# Patient Record
Sex: Female | Born: 1970 | Race: Black or African American | Hispanic: No | Marital: Single | Smoking: Never smoker
Health system: Southern US, Community
[De-identification: ages and names within clinical notes are randomized; demographics above are authoritative.]

## PROBLEM LIST (undated history)

## (undated) DIAGNOSIS — D649 Anemia, unspecified: Secondary | ICD-10-CM

## (undated) DIAGNOSIS — R198 Other specified symptoms and signs involving the digestive system and abdomen: Secondary | ICD-10-CM

## (undated) DIAGNOSIS — N920 Excessive and frequent menstruation with regular cycle: Secondary | ICD-10-CM

## (undated) DIAGNOSIS — Z8619 Personal history of other infectious and parasitic diseases: Secondary | ICD-10-CM

## (undated) DIAGNOSIS — L509 Urticaria, unspecified: Secondary | ICD-10-CM

## (undated) HISTORY — DX: Urticaria, unspecified: L50.9

## (undated) HISTORY — PX: CHOLECYSTECTOMY: SHX55

## (undated) HISTORY — DX: Other specified symptoms and signs involving the digestive system and abdomen: R19.8

## (undated) HISTORY — PX: ABDOMINAL HYSTERECTOMY: SHX81

## (undated) HISTORY — PX: TYMPANOSTOMY TUBE PLACEMENT: SHX32

## (undated) HISTORY — PX: TONSILLECTOMY: SUR1361

## (undated) HISTORY — DX: Personal history of other infectious and parasitic diseases: Z86.19

## (undated) HISTORY — PX: OTHER SURGICAL HISTORY: SHX169

## (undated) HISTORY — DX: Anemia, unspecified: D64.9

## (undated) HISTORY — PX: ADENOIDECTOMY: SUR15

## (undated) HISTORY — PX: TUBAL LIGATION: SHX77

## (undated) HISTORY — DX: Excessive and frequent menstruation with regular cycle: N92.0

---

## 1998-09-17 ENCOUNTER — Emergency Department (HOSPITAL_COMMUNITY): Admission: EM | Admit: 1998-09-17 | Discharge: 1998-09-18 | Payer: Self-pay | Admitting: Emergency Medicine

## 2001-08-25 ENCOUNTER — Encounter: Payer: Self-pay | Admitting: Nephrology

## 2001-08-25 ENCOUNTER — Encounter: Admission: RE | Admit: 2001-08-25 | Discharge: 2001-08-25 | Payer: Self-pay | Admitting: Nephrology

## 2001-11-18 ENCOUNTER — Encounter: Admission: RE | Admit: 2001-11-18 | Discharge: 2001-11-18 | Payer: Self-pay | Admitting: Nephrology

## 2001-11-18 ENCOUNTER — Encounter: Payer: Self-pay | Admitting: Nephrology

## 2002-11-12 ENCOUNTER — Other Ambulatory Visit: Admission: RE | Admit: 2002-11-12 | Discharge: 2002-11-12 | Payer: Self-pay | Admitting: Obstetrics and Gynecology

## 2002-12-24 ENCOUNTER — Emergency Department (HOSPITAL_COMMUNITY): Admission: EM | Admit: 2002-12-24 | Discharge: 2002-12-24 | Payer: Self-pay | Admitting: Emergency Medicine

## 2003-02-02 ENCOUNTER — Encounter: Payer: Self-pay | Admitting: Obstetrics and Gynecology

## 2003-02-02 ENCOUNTER — Inpatient Hospital Stay (HOSPITAL_COMMUNITY): Admission: AD | Admit: 2003-02-02 | Discharge: 2003-02-02 | Payer: Self-pay | Admitting: Obstetrics and Gynecology

## 2003-05-02 ENCOUNTER — Emergency Department (HOSPITAL_COMMUNITY): Admission: EM | Admit: 2003-05-02 | Discharge: 2003-05-02 | Payer: Self-pay | Admitting: Emergency Medicine

## 2003-05-10 ENCOUNTER — Inpatient Hospital Stay (HOSPITAL_COMMUNITY): Admission: AD | Admit: 2003-05-10 | Discharge: 2003-05-10 | Payer: Self-pay | Admitting: Obstetrics and Gynecology

## 2003-05-16 ENCOUNTER — Inpatient Hospital Stay (HOSPITAL_COMMUNITY): Admission: AD | Admit: 2003-05-16 | Discharge: 2003-05-16 | Payer: Self-pay | Admitting: Obstetrics and Gynecology

## 2003-05-26 ENCOUNTER — Inpatient Hospital Stay (HOSPITAL_COMMUNITY): Admission: AD | Admit: 2003-05-26 | Discharge: 2003-05-29 | Payer: Self-pay | Admitting: Obstetrics and Gynecology

## 2003-06-28 ENCOUNTER — Other Ambulatory Visit: Admission: RE | Admit: 2003-06-28 | Discharge: 2003-06-28 | Payer: Self-pay | Admitting: Obstetrics and Gynecology

## 2004-01-07 ENCOUNTER — Ambulatory Visit (HOSPITAL_COMMUNITY): Admission: RE | Admit: 2004-01-07 | Discharge: 2004-01-07 | Payer: Self-pay | Admitting: Obstetrics & Gynecology

## 2004-03-13 ENCOUNTER — Ambulatory Visit (HOSPITAL_COMMUNITY): Admission: RE | Admit: 2004-03-13 | Discharge: 2004-03-13 | Payer: Self-pay | Admitting: Obstetrics & Gynecology

## 2004-05-09 ENCOUNTER — Ambulatory Visit (HOSPITAL_COMMUNITY): Admission: RE | Admit: 2004-05-09 | Discharge: 2004-05-09 | Payer: Self-pay | Admitting: Obstetrics & Gynecology

## 2004-05-31 ENCOUNTER — Inpatient Hospital Stay (HOSPITAL_COMMUNITY): Admission: AD | Admit: 2004-05-31 | Discharge: 2004-06-02 | Payer: Self-pay | Admitting: Obstetrics

## 2004-07-12 ENCOUNTER — Inpatient Hospital Stay (HOSPITAL_COMMUNITY): Admission: AD | Admit: 2004-07-12 | Discharge: 2004-07-12 | Payer: Self-pay | Admitting: Obstetrics & Gynecology

## 2004-07-24 ENCOUNTER — Inpatient Hospital Stay (HOSPITAL_COMMUNITY): Admission: AD | Admit: 2004-07-24 | Discharge: 2004-07-29 | Payer: Self-pay | Admitting: Obstetrics & Gynecology

## 2004-07-28 ENCOUNTER — Encounter (INDEPENDENT_AMBULATORY_CARE_PROVIDER_SITE_OTHER): Payer: Self-pay | Admitting: *Deleted

## 2005-05-25 ENCOUNTER — Emergency Department (HOSPITAL_COMMUNITY): Admission: EM | Admit: 2005-05-25 | Discharge: 2005-05-25 | Payer: Self-pay | Admitting: Emergency Medicine

## 2006-04-25 ENCOUNTER — Emergency Department (HOSPITAL_COMMUNITY): Admission: EM | Admit: 2006-04-25 | Discharge: 2006-04-26 | Payer: Self-pay | Admitting: Emergency Medicine

## 2007-01-21 ENCOUNTER — Inpatient Hospital Stay (HOSPITAL_COMMUNITY): Admission: AD | Admit: 2007-01-21 | Discharge: 2007-01-21 | Payer: Self-pay | Admitting: Obstetrics and Gynecology

## 2007-01-30 ENCOUNTER — Other Ambulatory Visit: Admission: RE | Admit: 2007-01-30 | Discharge: 2007-01-30 | Payer: Self-pay | Admitting: Obstetrics and Gynecology

## 2007-03-18 ENCOUNTER — Encounter: Admission: RE | Admit: 2007-03-18 | Discharge: 2007-03-18 | Payer: Self-pay | Admitting: Gastroenterology

## 2007-04-09 ENCOUNTER — Encounter: Admission: RE | Admit: 2007-04-09 | Discharge: 2007-04-09 | Payer: Self-pay | Admitting: Surgery

## 2007-04-30 ENCOUNTER — Ambulatory Visit (HOSPITAL_COMMUNITY): Admission: RE | Admit: 2007-04-30 | Discharge: 2007-04-30 | Payer: Self-pay | Admitting: Obstetrics

## 2007-07-03 ENCOUNTER — Inpatient Hospital Stay (HOSPITAL_COMMUNITY): Admission: AD | Admit: 2007-07-03 | Discharge: 2007-07-03 | Payer: Self-pay | Admitting: Obstetrics

## 2007-08-21 ENCOUNTER — Ambulatory Visit (HOSPITAL_COMMUNITY): Admission: RE | Admit: 2007-08-21 | Discharge: 2007-08-21 | Payer: Self-pay | Admitting: Obstetrics

## 2007-08-23 ENCOUNTER — Inpatient Hospital Stay (HOSPITAL_COMMUNITY): Admission: AD | Admit: 2007-08-23 | Discharge: 2007-08-23 | Payer: Self-pay | Admitting: Obstetrics & Gynecology

## 2007-10-21 ENCOUNTER — Emergency Department (HOSPITAL_COMMUNITY): Admission: EM | Admit: 2007-10-21 | Discharge: 2007-10-21 | Payer: Self-pay | Admitting: Emergency Medicine

## 2007-10-22 ENCOUNTER — Encounter (INDEPENDENT_AMBULATORY_CARE_PROVIDER_SITE_OTHER): Payer: Self-pay | Admitting: General Surgery

## 2007-10-22 ENCOUNTER — Ambulatory Visit (HOSPITAL_COMMUNITY): Admission: EM | Admit: 2007-10-22 | Discharge: 2007-10-23 | Payer: Self-pay | Admitting: Emergency Medicine

## 2007-12-26 IMAGING — US US PELVIS COMPLETE MODIFY
1 series · 13 of 25 positions shown · non-contrast
Comparison: CT abdomen and pelvis 04/09/07 and ultrasound abdomen and pelvis 1774.

CLINICAL DATA: Abdominal and pelvic pain.  
 TRANSABDOMINAL AND TRANSVAGINAL PELVIC ULTRASOUND:
TECHNIQUE: Both transabdominal and transvaginal ultrasound examinations of the pelvis were performed including evaluation of the uterus, ovaries, adnexal regions, and pelvic cul-de-sac.

[Series 1: us pelvis complete modify · 0.23mm/px · 13 of 45 slices shown]
[im 1/45]
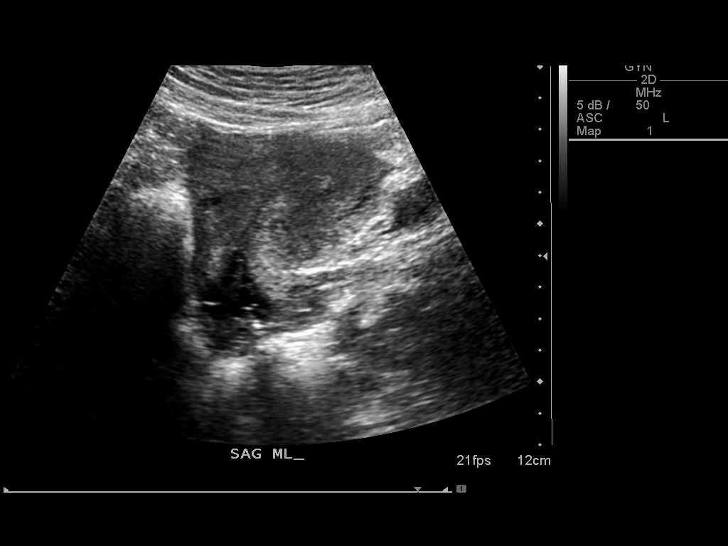
[im 4/45]
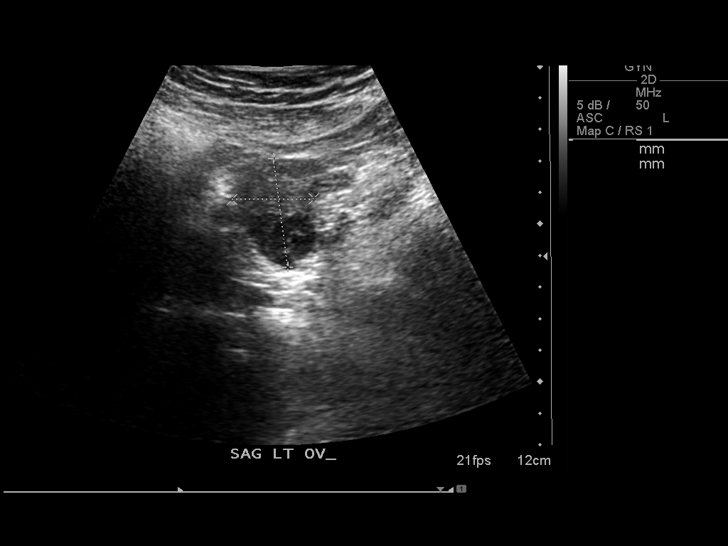
[im 8/45]
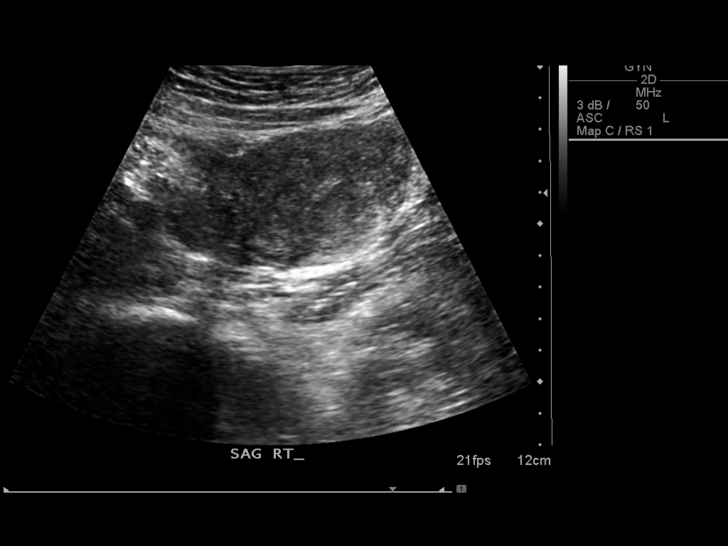
[im 12/45]
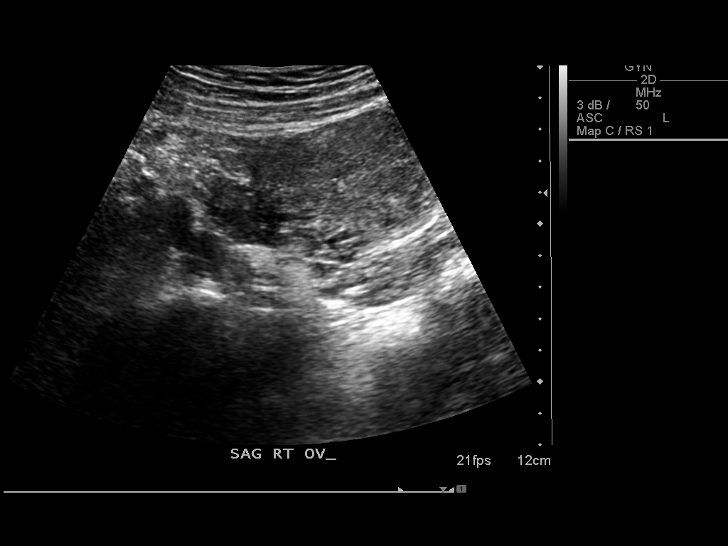
[im 15/45]
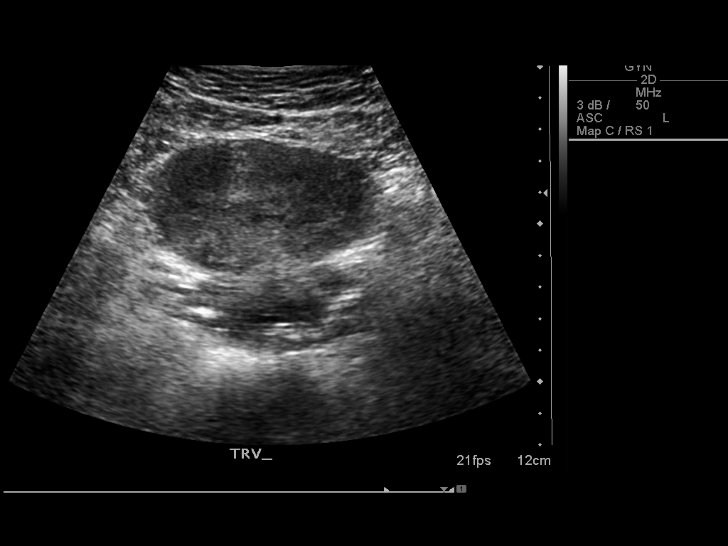
[im 19/45]
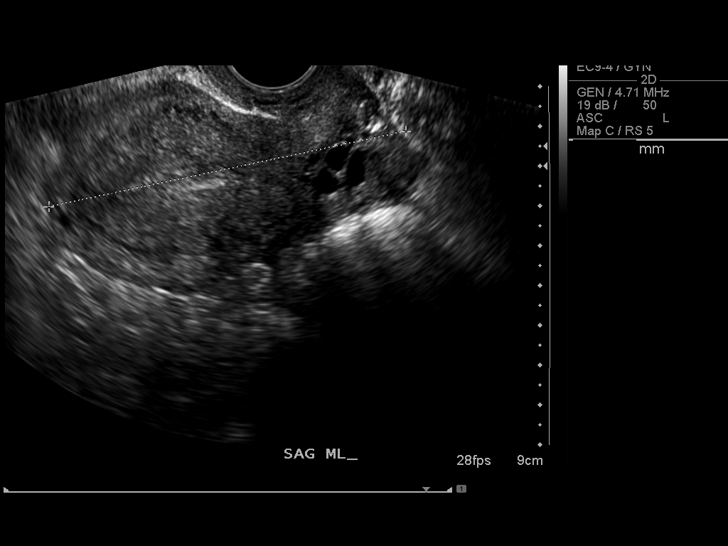
[im 23/45]
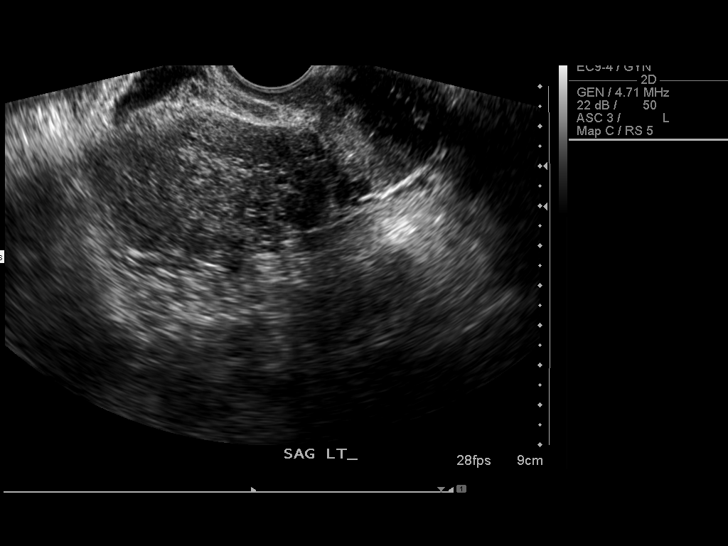
[im 26/45]
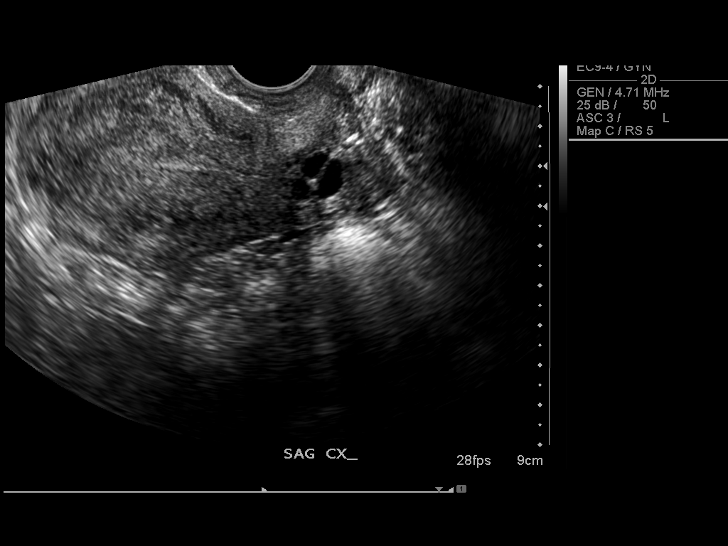
[im 30/45]
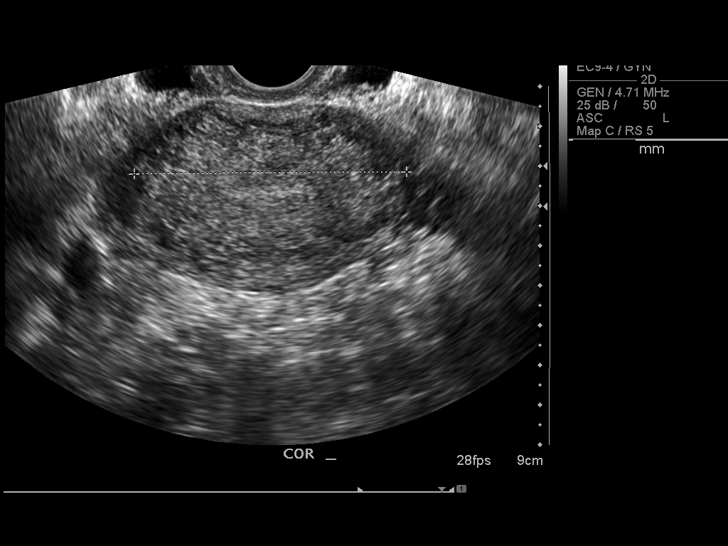
[im 34/45]
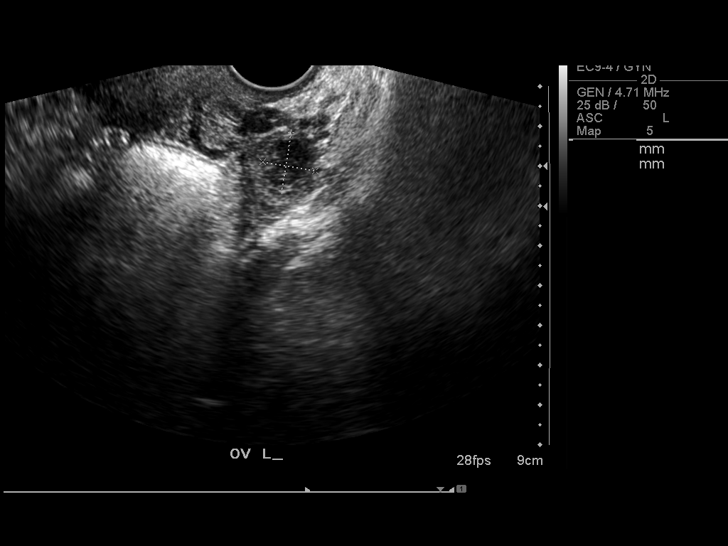
[im 37/45]
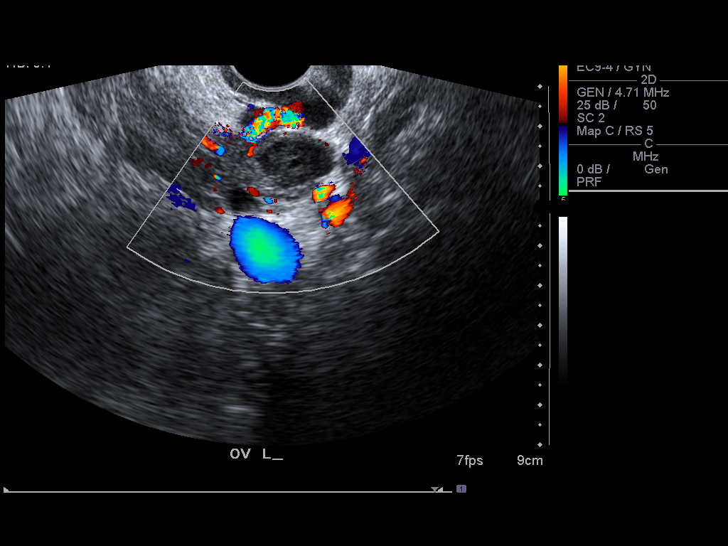
[im 41/45]
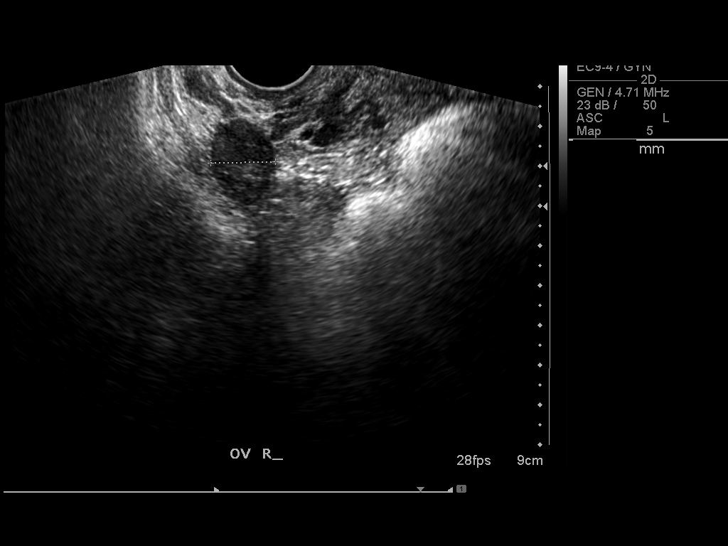
[im 45/45]
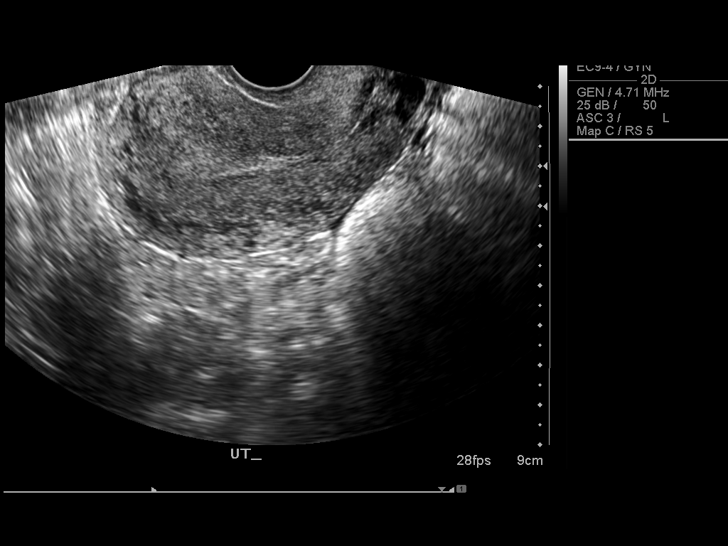

[13 of 25 positions shown; findings below may reference images not displayed]

FINDINGS: The uterus measures 9.1 cm in length x 5.1 cm AP diameter x 6.8 cm transverse diameter.  Uterine echotexture is fairly homogeneous, without evidence of discrete myometrial mass.  The endometrium is 8 mm in greatest thickness.  
 The right ovary is 2.7 x 2.3 x 1.6 cm in size and contains a few small follicles.  
 The left ovary is 4.0 x 1.8 x 2.0 cm and contains a 2.2 x 1.5 x 1.3 cm mildly complex cyst with some internal echoes.  With color Doppler imaging, no flow is identified within the cyst.  Flow is seen within the surrounding left ovarian tissue.  A small amount of free fluid is identified in the adnexa.
IMPRESSION: 1.  2.2 x 1.5 x 1.3 cm mildly complex cyst in the left ovary.  The appearance is nonspecific but may represent a small hemorrhagic cyst.
 2.  Normal appearance of the uterus and left ovary.  
 3.  Small amount of free fluid in the pelvis.

## 2010-07-09 ENCOUNTER — Encounter: Payer: Self-pay | Admitting: Gastroenterology

## 2010-10-31 NOTE — H&P (Signed)
[name], [name] NO.:  [id number]   MEDICAL RECORD NO.:  [id number]          PATIENT TYPE:  EMS   LOCATION:  ED                           FACILITY:  [facility]   PHYSICIAN:  Ollen Gross. Vernell Morgans, M.D. DATE OF BIRTH:  [birth year]   DATE OF ADMISSION:  10/22/2007  DATE OF DISCHARGE:                              HISTORY & PHYSICAL   HISTORY OF PRESENT ILLNESS:  [name] is a 40 year old black female  who presents to the emergency department today with right upper quadrant  pain.  The pain she states has been off and on for the last two weeks.  The pain got severe yesterday to the point where she needed to come to  the emergency department.  The pain has been associated with significant  nausea and vomiting, but denies any fevers.  No chest pain, shortness of  breath.  No diarrhea or dysuria.  The rest of review of systems are  unremarkable.   PAST MEDICAL HISTORY:  Significant for asthma.   PAST SURGICAL HISTORY:  Significant for tubal ligation.   MEDICATIONS:  Multivitamins, Tylenol, albuterol, oxycodone, Cipro.   ALLERGIES:  NO KNOWN DRUG ALLERGIES.   SOCIAL HISTORY:  No history of tobacco or alcohol products.   FAMILY HISTORY:  Significant for diabetes and hypertension in her  parents.   PHYSICAL EXAMINATION:  VITAL SIGNS:  Temperature 98.3, blood pressure  105/59, pulse 76.  GENERAL:  She is a well-developed, well-nourished black female in no  acute distress.  SKIN:  Warm and dry, no jaundice  HEENT:  Eyes:  Extraocular movements intact.  Pupils equal, round and  reactive to light.  Sclerae are nonicteric.  LUNGS:  Clear bilaterally with no use of accessory respiratory muscles.  HEART:  Regular rate and rhythm  with noted pulses in the left chest.  ABDOMEN:  Soft with mild right upper quadrant tenderness but no guarding  or peritonitis.  No palpable mass or hepatosplenomegaly.  EXTREMITIES:  No cyanosis, clubbing or edema.  Good strength in her arms  and legs.  PSYCHOLOGIC:  Alert and oriented x3.  No evidence of anxiety or  depression.   LABORATORY DATA:  On review of her lab work, she had a white count of  13.2, liver functions were normal, lipase was normal.  On review of her  ultrasound, she did have stones in her gallbladder, but no gallbladder  wall thickening or ductal dilatation.   ASSESSMENT/PLAN:  A 40 year old black female with what seems like  symptomatic gallstones.  Because of the risk of further painful episodes  and possible pancreatitis, I do think she would benefit from having her  gallbladder removed.  She would also like to have this done.  I have  explained to her in detail the risks and benefits of the operation to  remove the gallbladder as well as some alternative prospects and she  understands and wishes to  proceed.  Her family seems very concerned that she may be pregnant, even  though she has had a tubal ligation, so we will, prior to surgery, plan  also  for a pregnancy test.  If this is negative, I think we can proceed  with surgery this afternoon.      Ollen Gross. Vernell Morgans, M.D.  Electronically Signed     PST/MEDQ  D:  10/22/2007  T:  10/22/2007  Job:  [id number]

## 2010-10-31 NOTE — Op Note (Signed)
NAMEARZU, MCGAUGHEY NO.:  [id number]   MEDICAL RECORD NO.:  [id number]          PATIENT TYPE:  INP   LOCATION:  0098                         FACILITY:  [facility]   PHYSICIAN:  Ollen Gross. Vernell Morgans, M.D. DATE OF BIRTH:  1971/06/18   DATE OF PROCEDURE:  10/22/2007  DATE OF DISCHARGE:                               OPERATIVE REPORT   PREOPERATIVE DIAGNOSIS:  Gallstones.   POSTOPERATIVE DIAGNOSIS:  Gallstones.   PROCEDURE:  Laparoscopic cholecystectomy with intraoperative  cholangiogram.   SURGEON:  Ollen Gross. Vernell Morgans, M.D.   ANESTHESIA:  General endotracheal.   PROCEDURE:  After informed consent was obtained, the patient was brought  to the operating room and placed in the supine position on the operating  room table.  After an adequate dose of general anesthesia, the patient's  abdomen was prepped with Betadine and draped in the usual sterile  manner.  The area below the umbilicus was infiltrated with 0.25%  Marcaine and a small incision was made with a 15 blade knife.  This  incision was carried down through the subcutaneous tissue bluntly with a  hemostat and [employer] retractors until the linea alba was identified.  The linea alba was incised with a 15 blade knife and each side was  grasped with Kocher clamps and elevated anteriorly.  The preperitoneal  space was then entered bluntly with a hemostat until the peritoneum was  opened and access was gained to the abdominal cavity.  A 0 Vicryl  pursestring stitch was placed in the fascia around the opening.  A  Hasson cannula was placed through the opening and anchored in place with  the pursestring stitch.  The abdomen was insufflated with carbon dioxide  without difficulty.  The patient was placed in reverse Trendelenburg  position and rotated with the right side up.  A laparoscope was inserted  through the Hasson cannula and the right upper quadrant was inspected.  The dome of the gallbladder and liver were  identified.  Next the  epigastric region was infiltrated with 0.25% Marcaine.  A small incision  was made with a 15 blade knife and a 10 mm port was placed bluntly  through this incision into the abdominal cavity under direct vision.  Sites were then chosen laterally on the right side of the abdomen for  placement of 5 mm ports. Each of these areas was infiltrated with 0.25%  Marcaine.  Small stab incisions were made with a 15 blade knife, 5 mm  ports were placed bluntly through these incisions into the abdominal  cavity under direct vision.  A blunt grasper was placed through the  latter-most 5 mm port and used to grasp the dome of the gallbladder and  elevate it anteriorly and superiorly.  Another blunt grasper was placed  through the other 5 mm port and used to retract on the body and neck of  the gallbladder.  Dissector was placed through the epigastric port and  using the electrocautery the peritoneal reflection of the gallbladder  neck was opened.  Blunt dissection was then carried out in this area  until  the gallbladder neck and cystic duct junction was readily  identified and a good window was created.  A single clip was placed on  the gallbladder neck.  A small ductotomy was made just below the clip  with the laparoscopic scissors.  A 14-gauge Angiocath was placed  percutaneously through the anterior abdominal wall under direct vision.  A Reddick cholangiogram catheter was placed through the Angiocath and  flushed.  The Reddick catheter was then placed within the cystic duct  and anchored in place with a clip.  Cholangiogram was obtained and it  showed no filling defects, good emptying in the duodenum, and adequate  length on the cystic duct.  The anchoring clip and catheters were  removed from the patient.  Three clips were placed proximally on the  cystic duct and the duct was divided between the 2 sets of clips.  Posterior to this the cystic artery was identified and again  dissected  bluntly in a circumferential manner until a good window was created.  Two clips were placed proximally and one distally on the artery and the  artery was divided between the 2 sets of clips.  Next, a laparoscopic  hook cautery device was used to separate the gallbladder from the liver  bed.  Prior to detaching the gallbladder from the liver bed, the liver  bed was inspected and several bleeding points were coagulated with the  electrocautery until the area was completely hemostatic.  The  gallbladder was then detached the rest of the way from the liver bed  without difficulty with the hook cautery.  The laparoscopic bag was  inserted through the epigastric port.  The gallbladder was placed in the  bag and the bag was sealed.  The abdomen was then irrigated with copious  amounts of saline until the effluent was clear.  The abdomen was  inspected and no other abnormalities were noted.  The laparoscope was  then moved to the epigastric port.  A gallbladder grasper was placed  through the Hasson cannula and using the grasp the opening of the bag.  The bag with the gallbladder was removed through the infraumbilical port  without much difficulty.  We did have to open the fascia just slightly  superiorly to get the gallbladder out as it was full of stones.  Once  this was accomplished, we closed the fascia of the infraumbilical  incision with 2 extra interrupted 0 Vicryl stitches as well as with the  previously placed 0 Vicryl pursestring stitch.  The rest of the ports  were removed under direct vision and were found to be hemostatic.  The  gas was allowed to escape.  The skin incisions were all closed with  interrupted 4-0 Monocryl subcuticular stitches and Dermabond dressings  were applied.  The patient tolerated the procedure well.  At the end of  the case all needle, sponge and instrument counts were correct.  The  patient was then awakened and taken to the recovery room in  stable  condition.      Ollen Gross. Vernell Morgans, M.D.  Electronically Signed     PST/MEDQ  D:  10/22/2007  T:  10/22/2007  Job:  [id number]

## 2010-11-03 NOTE — Op Note (Signed)
NAME:  [name], [name]                         ACCOUNT NO.:  [id number]   MEDICAL RECORD NO.:  [id number]                   PATIENT TYPE:  INP   LOCATION:  9134                                 FACILITY:  [facility]   PHYSICIAN:  Charles A. Sydnee Cabal, MD            DATE OF BIRTH:  May 15, 1971   DATE OF PROCEDURE:  05/27/2003  DATE OF DISCHARGE:                                 OPERATIVE REPORT   DELIVERY NOTE:  A 40 year old gravida 3, para 1, AB 1, EDC December 12, 39  weeks 3 days estimated gestational age, upon admission yesterday presented  complaining of spontaneous rupture of membranes.  She has had prenatal care  complicated by asthma and equivocal rubella.  She takes Proventil and  Pulmicort p.r.n.  She has not used these in the last several weeks and is  not feeling like she is having any wheezing or tightness.  Group B strep was  positive.  She presented complaining of ruptured membranes.  Meconium fluid  was noted.  She was admitted, had reactive fetal heart rate.  Cervix was 3  cm dilated, 40% effaced, -3 station.  Meconium was noted.  IUPC was placed.  Amnioinfusion was begun.  Care was turned over to me on call by Fayrene Fearing A.  Ashley Royalty, M.D.  She continued to do well.  Pitocin had been started.  Pitocin was used at low-dose protocol and then advanced up to the maximum of  30, and adequate labor was achieved.  She became completely dilated at 0305.  She labored down for about an hour and then began pushing.  She had  spontaneous vaginal delivery at 0555.  The placenta followed spontaneously  just after delivery.  For specific time, see delivery sheet.  She had a  spontaneous vaginal delivery of a vigorous female, Apgars 8 and 9, cord  gases returned arterial 7.24, venous 7.38.  The placenta was spontaneous,  three-vessel and intact.  DeLee suction was done on the perineum.  The  neonatologist was present secondary to meconium.  The patient gave a history  of late postpartum hemorrhage  10-15 minutes after delivery with first  delivery and for this reason, prophylactic 400 mcg of Cytotec was given per  rectum with patient consent.  She tolerated delivery well.  There was no  evidence of hemorrhage.  The uterus was firm to massage and estimated blood  loss was 400 mL.  Mother and baby are recovering safely at this time.   COMPLICATIONS:  1. Group B strep positive.  2. Asthma.  3. Nuchal cord x1.   PROCEDURES:  1. Pitocin induction.  2. Spontaneous vaginal delivery.                                               Charles A. Sydnee Cabal, MD  CAD/MEDQ  D:  05/27/2003  T:  05/27/2003  Job:  [id number]

## 2010-11-03 NOTE — Discharge Summary (Signed)
NAME:  [name], [name]                         ACCOUNT NO.:  [id number]   MEDICAL RECORD NO.:  [id number]                   PATIENT TYPE:  INP   LOCATION:  9134                                 FACILITY:  [facility]   PHYSICIAN:  James A. Ashley Royalty, M.D.             DATE OF BIRTH:  04/05/1971   DATE OF ADMISSION:  05/26/2003  DATE OF DISCHARGE:  05/29/2003                                 DISCHARGE SUMMARY   DISCHARGE DIAGNOSES:  1. Intrauterine pregnancy at 39 weeks and 3 days' gestation.  2. Premature rupture of membranes.  3. Meconium stained amniotic fluid.  4. Group B streptococcus positive.  5. Asthma.  6. Term birth, living child, vertex.   OPERATIONS/SPECIAL PROCEDURES:  OB delivery with administration of Cytotec  prophylactically.   CONSULTATIONS:  None.   DISCHARGE MEDICATIONS:  Tylox.   HISTORY AND PHYSICAL:  This is a 40 year old gravida 3 para 1, AB 1, 39  weeks and 3 days' gestation with the aforementioned risk factors.  The  patient presented complaining of premature rupture of membranes.  Remainder  of the history and physical, please see chart.   HOSPITAL COURSE:  The patient was admitted to [facility] [place].  Admission laboratory studies were drawn.  She went on to labor and deliver  on May 27, 2003.  The infant was a 6 pound 12 ounce female.  Apgar's 8  and 9.  Sent to the newborn nursery.  Delivery was __________ by Dr.  __________ .  The patient was given Cytotec in the delivery room  prophylactically due to history of a postpartum hemorrhage.  Her postpartum  course was benign.  She was discharged on the second postpartum day,  afebrile and in satisfactory condition.   ACCESSORY CLINICAL FINDINGS:  Hemoglobin and hematocrit, on admission, were  12.1 and 36.1 respectively.  Repeat values were obtained, May 28, 2003,  and were 10.0 and 28.6 respectively.   DISPOSITION:  The patient is to return to [facility]  in 4-6  weeks for postpartum evaluation.                                               James A. Ashley Royalty, M.D.    JAM/MEDQ  D:  07/28/2003  T:  07/28/2003  Job:  [id number]

## 2010-11-03 NOTE — Discharge Summary (Signed)
NAMEADAM, DEMARY               ACCOUNT NO.:  [id number]   MEDICAL RECORD NO.:  [id number]          PATIENT TYPE:  INP   LOCATION:  9135                          FACILITY:  [facility]   PHYSICIAN:  Roseanna Rainbow, M.D.DATE OF BIRTH:  Oct 01, 1970   DATE OF ADMISSION:  07/24/2004  DATE OF DISCHARGE:  07/29/2004                                 DISCHARGE SUMMARY   HISTORY OF PRESENT ILLNESS:  The patient is a 40 year old gravida 4, para 2-  0-1-2 with an estimated date of confinement of August 10, 2004 with an  intrauterine pregnancy at 37 weeks, complaining of uterine contractions.   ANTEPARTUM COURSE:  Prenatal care with [facility].  No pregnancy  complications or risks.  Prenatal labs:  Blood type/RH: A positive, antibody  screen negative, RPR nonreactive, rubella immune, hepatitis B surface  antigen negative, GBS negative, HIV negative.   PAST OB/GYN HISTORY:  She has had two spontaneous vaginal deliveries.  The  first delivery was complicated by a postpartum hemorrhage.   PAST MEDICAL HISTORY:  Asthma.   PAST SURGICAL HISTORY:  Myringotomy tubes as a child.   ACTIVITY:  NO KNOWN DRUG ALLERGIES.   MEDICATIONS:  1.  Prenatal vitamins.  2.  Albuterol.   PHYSICAL EXAMINATION:  VITAL SIGNS:  Temperature 98.1, pulse 88,  respirations 18, blood pressure 107/59.  GENERAL:  No apparent distress.  ABDOMEN:  Gravid.  Fetal heart tracing reassuring.  Contractions every five  minutes.  Sterile vaginal exam:  5 cm dilated, 60% effaced, with the vertex  at a -3.   ASSESSMENT:  Intrauterine pregnancy at 37+ weeks, late latent versus early  active labor.  Fetal heart tracing consistent with fetal wellbeing.   PLAN:  Admission and expectant management.   HOSPITAL COURSE:  The patient was admitted.  She received Pitocin  augmentation of labor.  She was delivered of a liveborn female.  Apgars 9 at  one and five minutes, respectively.  She had a first-degree perineal  laceration.   On postpartum day #2, she underwent a tubal ligation.  Please see the  dictated operative summary for further details.   She was discharged to home on postoperative day #1, postpartum day #3.   DISCHARGE DIAGNOSES:  1.  Intrauterine pregnancy at term.  2.  Multiparity, desires sterilization procedure.   PROCEDURE:  Spontaneous vaginal delivery, postpartum bilateral tubal  ligation.   CONDITION ON DISCHARGE:  Good.   DIET:  Regular.   ACTIVITY:  Pelvic rest, no strenuous activity.   DISCHARGE MEDICATIONS:  1.  Tylenol No. 3.  2.  Ibuprofen.   FOLLOW UP:  The patient is to follow up in the office in six weeks.      LAJ/MEDQ  D:  08/24/2004  T:  08/24/2004  Job:  [id number]

## 2010-11-03 NOTE — Discharge Summary (Signed)
[name], [name]               ACCOUNT NO.:  [id number]   MEDICAL RECORD NO.:  [id number]          PATIENT TYPE:  INP   LOCATION:  9152                          FACILITY:  [facility]   PHYSICIAN:  Charles A. Clearance Coots, M.D.DATE OF BIRTH:  [birth year]   DATE OF ADMISSION:  05/31/2004  DATE OF DISCHARGE:  06/02/2004                                 DISCHARGE SUMMARY   ADMITTING DIAGNOSES:  1.  Thirty weeks gestation.  2.  Uterine contractions.   DISCHARGE DIAGNOSES:  1.  Thirty weeks gestation.  2.  Uterine contractions.  3.  Much improved after intravenous fluid hydration and bedrest.  Discharge      home undelivered at [redacted] weeks gestation in good condition.   REASON FOR ADMISSION:  A 40 year old G21 P2 black female; estimated date of  confinement of August 09, 2003; presents with dizziness for a week and  complaining of being tired, and also complained of uterine contractions that  she has had for the past 2 months.  The uterine contractions actually are  not as bad as they had been over the previous month.  She denied vaginal  bleeding or dysuria, fever or chills, diarrhea or constipation.   PAST MEDICAL HISTORY:  1.  Surgery:  Tonsillectomy any myringotomy tubes for ears, and wisdom teeth      extraction.  2.  Illnesses:  Asthma.   MEDICATIONS:  Prenatal vitamins.   ALLERGIES:  No known drug allergies.   SOCIAL HISTORY:  Single.  Negative tobacco, alcohol, or recreational drug  use.   PHYSICAL EXAMINATION:  GENERAL:  Well-nourished, well-developed black female  in no acute distress.  VITAL SIGNS:  She is afebrile and vital signs are stable.  LUNGS:  Clear to auscultation bilaterally.  HEART:  Regular rate and rhythm.  ABDOMEN:  Gravid, nontender.  PELVIC:  Sterile speculum exam revealed moderate discharge.  Wet prep, GC,  chlamydia cultures and a group B strep culture were done.  Cervix was long,  closed, posterior, and presenting part was high.  EXTERNAL FETAL MONITOR:   Reactive and revealed uterine contractions  approximately every 10-12 minutes initially, then every 3-4 minutes after  approximately 30 minutes.  The patient was uncomfortable with the uterine  contractions.   IMPRESSION:  1.  Thirty weeks gestation.  2.  Preterm uterine contractions.  3.  Dizziness.   PLAN:  Admit for observation, IV fluid hydration, and supportive management.   LABORATORY VALUES:  Wet prep revealed too numerous to count white blood  cells and too numerous to count bacteria.  Urine revealed a specific gravity  of 1.015, greater than 80 ketones.   HOSPITAL COURSE:  The patient was admitted and continued on IV fluid  hydration and given a therapeutic rest with magnesium sulfate IM.  She  responded well to therapy, by hospital day #2 was having no contractions.  The labs revealed group B strep was negative, fetal fibronectin was also  negative, GC and chlamydia cultures were negative, urine culture was  negative.  The patient continued to have no contractions and was quite  comfortable, and was therefore  discharged home on hospital day #2 in good  condition, undelivered, at [redacted] weeks gestation.   DISCHARGE DISPOSITION:  1.  Medications:  Continue prenatal vitamins.  2.  Routine written instructions were given for undelivered at [redacted] weeks      gestation.  3.  The patient is to call office for an appointment for a follow-up in 1      week.     Char   CAH/MEDQ  D:  06/02/2004  T:  06/02/2004  Job:  [id number]

## 2010-11-03 NOTE — Op Note (Signed)
[name], [name]               ACCOUNT NO.:  [id number]   MEDICAL RECORD NO.:  [id number]          PATIENT TYPE:  INP   LOCATION:  9135                          FACILITY:  [facility]   PHYSICIAN:  Roseanna Rainbow, M.D.DATE OF BIRTH:  [birth year]   DATE OF PROCEDURE:  07/28/2004  DATE OF DISCHARGE:                                 OPERATIVE REPORT   PREOPERATIVE DIAGNOSIS:  Multiparity, desires sterilization procedure.   POSTOPERATIVE DIAGNOSIS:  Multiparity, desires sterilization procedure.   PROCEDURE:  Modified Pomeroy bilateral tubal ligation.   SURGEON:  Roseanna Rainbow, M.D.   ANESTHESIA:  Epidural.   ESTIMATED BLOOD LOSS:  Less than 50 mL.   IV FLUID:  As per anesthesiology.   COMPLICATIONS:  None.   PROCEDURE:  The patient was taken to the operating room with an epidural  catheter in place and an IV running.  She was placed in the dorsal supine  position and prepped and draped in the usual sterile fashion.  An  infraumbilical skin incision was then made with the scalpel and carried down  to the underlying fascia.  The fascia was tented up with Kocher clamps and  entered sharply.  The parietal peritoneum was tented up and entered sharply  as well.  The incision was extended bilaterally.  The peritoneal incision  was free of adhesions.  Retractors were placed into the incision.  The right  fallopian tube was then grasped with a Babcock clamp and followed out to the  fimbriated end.  The midisthmic portion of the tube was then regrasped.  Two  free ligatures of 0 plain were then placed and the segment of tube excised.  Adequate hemostasis was noted.  The left fallopian tube was manipulated in a  similar fashion. The fascia and parietal peritoneum were reapproximated in a  bulk fashion using running 0 Vicryl suture.  The skin was reapproximated in  subcuticular fashion using 3-0 Monocryl.  At the close of the procedure, the  instrument and pack counts were said to  be correct x2.  The patient was  taken to the PACU awake and in stable condition.      LAJ/MEDQ  D:  07/28/2004  T:  07/28/2004  Job:  [id number]

## 2011-03-08 LAB — URINALYSIS, ROUTINE W REFLEX MICROSCOPIC
Bilirubin Urine: NEGATIVE
Glucose, UA: NEGATIVE
Ketones, ur: NEGATIVE
Leukocytes, UA: NEGATIVE
Specific Gravity, Urine: 1.02
Urobilinogen, UA: 0.2
pH: 7

## 2011-03-08 LAB — WET PREP, GENITAL
Clue Cells Wet Prep HPF POC: NONE SEEN
Yeast Wet Prep HPF POC: NONE SEEN

## 2011-03-08 LAB — DIFFERENTIAL
Basophils Absolute: 0
Eosinophils Absolute: 0.1
Eosinophils Relative: 1
Lymphocytes Relative: 22

## 2011-03-08 LAB — URINE MICROSCOPIC-ADD ON

## 2011-03-08 LAB — GC/CHLAMYDIA PROBE AMP, GENITAL
Chlamydia, DNA Probe: NEGATIVE
GC Probe Amp, Genital: NEGATIVE

## 2011-03-08 LAB — CBC: WBC: 9.2

## 2011-03-08 LAB — POCT PREGNANCY, URINE: Preg Test, Ur: NEGATIVE

## 2011-03-12 LAB — WET PREP, GENITAL
Trich, Wet Prep: NONE SEEN
Yeast Wet Prep HPF POC: NONE SEEN

## 2011-03-12 LAB — CBC
Hemoglobin: 12.4
MCHC: 33.3
MCV: 80.8
Platelets: 305
WBC: 9.3

## 2011-03-12 LAB — URINALYSIS, ROUTINE W REFLEX MICROSCOPIC
Bilirubin Urine: NEGATIVE
Ketones, ur: NEGATIVE
Nitrite: NEGATIVE
Specific Gravity, Urine: 1.025

## 2011-03-12 LAB — URINE MICROSCOPIC-ADD ON

## 2011-03-12 LAB — POCT PREGNANCY, URINE: Operator id: 28886

## 2011-04-02 LAB — GC/CHLAMYDIA PROBE AMP, GENITAL
Chlamydia, DNA Probe: NEGATIVE
GC Probe Amp, Genital: NEGATIVE

## 2011-04-02 LAB — CBC
MCHC: 31.8
MCV: 77.3 — ABNORMAL LOW
Platelets: 337

## 2011-04-02 LAB — URINALYSIS, ROUTINE W REFLEX MICROSCOPIC
Ketones, ur: NEGATIVE
Nitrite: NEGATIVE
Protein, ur: NEGATIVE

## 2011-04-02 LAB — DIFFERENTIAL
Basophils Relative: 0
Eosinophils Absolute: 0.1
Monocytes Relative: 5
Neutrophils Relative %: 69

## 2011-04-02 LAB — WET PREP, GENITAL: Trich, Wet Prep: NONE SEEN

## 2011-04-02 LAB — HCG, SERUM, QUALITATIVE: Preg, Serum: NEGATIVE

## 2011-06-14 ENCOUNTER — Encounter: Payer: Self-pay | Admitting: *Deleted

## 2011-06-14 ENCOUNTER — Emergency Department (HOSPITAL_COMMUNITY): Payer: Medicaid Other

## 2011-06-14 ENCOUNTER — Emergency Department (HOSPITAL_COMMUNITY)
Admission: EM | Admit: 2011-06-14 | Discharge: 2011-06-14 | Disposition: A | Discharge status: Home / Self Care | Payer: Medicaid Other | Attending: Emergency Medicine | Admitting: Emergency Medicine

## 2011-06-14 DIAGNOSIS — N888 Other specified noninflammatory disorders of cervix uteri: Secondary | ICD-10-CM

## 2011-06-14 DIAGNOSIS — R141 Gas pain: Secondary | ICD-10-CM | POA: Insufficient documentation

## 2011-06-14 DIAGNOSIS — R109 Unspecified abdominal pain: Secondary | ICD-10-CM | POA: Insufficient documentation

## 2011-06-14 DIAGNOSIS — N949 Unspecified condition associated with female genital organs and menstrual cycle: Secondary | ICD-10-CM | POA: Insufficient documentation

## 2011-06-14 DIAGNOSIS — R142 Eructation: Secondary | ICD-10-CM | POA: Insufficient documentation

## 2011-06-14 DIAGNOSIS — R143 Flatulence: Secondary | ICD-10-CM | POA: Insufficient documentation

## 2011-06-14 LAB — URINALYSIS, ROUTINE W REFLEX MICROSCOPIC
Glucose, UA: NEGATIVE mg/dL
Ketones, ur: NEGATIVE mg/dL
Protein, ur: NEGATIVE mg/dL

## 2011-06-14 LAB — CBC
HCT: 37.7 % (ref 36.0–46.0)
MCH: 25.6 pg — ABNORMAL LOW (ref 26.0–34.0)
MCHC: 32.6 g/dL (ref 30.0–36.0)
RDW: 14.7 % (ref 11.5–15.5)

## 2011-06-14 LAB — DIFFERENTIAL
Basophils Absolute: 0 10*3/uL (ref 0.0–0.1)
Basophils Relative: 0 % (ref 0–1)
Eosinophils Absolute: 0.1 10*3/uL (ref 0.0–0.7)
Eosinophils Relative: 1 % (ref 0–5)
Monocytes Absolute: 0.6 10*3/uL (ref 0.1–1.0)
Neutro Abs: 8.7 10*3/uL — ABNORMAL HIGH (ref 1.7–7.7)

## 2011-06-14 LAB — URINE MICROSCOPIC-ADD ON

## 2011-06-14 LAB — COMPREHENSIVE METABOLIC PANEL
AST: 15 U/L (ref 0–37)
Albumin: 3.7 g/dL (ref 3.5–5.2)
Calcium: 10.1 mg/dL (ref 8.4–10.5)
Creatinine, Ser: 0.75 mg/dL (ref 0.50–1.10)
Total Protein: 8.2 g/dL (ref 6.0–8.3)

## 2011-06-14 LAB — POCT PREGNANCY, URINE: Preg Test, Ur: NEGATIVE

## 2011-06-14 LAB — LIPASE, BLOOD: Lipase: 43 U/L (ref 11–59)

## 2011-06-14 MED ORDER — HYDROCODONE-ACETAMINOPHEN 5-325 MG PO TABS: 2.0000 | ORAL_TABLET | ORAL | Status: AC | PRN

## 2011-06-14 MED ORDER — IOHEXOL 300 MG/ML  SOLN
100.0000 mL | Freq: Once | INTRAMUSCULAR | Status: AC | PRN
  Administered 2011-06-14: 100 mL via INTRAVENOUS

## 2011-06-14 NOTE — ED Notes (Signed)
Pt reports abd pain intermittently x 1 month.  Never saw anyone for it.  Pt denies n/v/d with the pain.  Pt's abd appears distended and is soft.  Mild tenderness noted.  Pt reports regular BM-denies any urinary sxs.

## 2011-06-14 NOTE — ED Notes (Signed)
Patient transported to CT 

## 2011-06-14 NOTE — Discharge Instructions (Signed)
Your laboratory blood work were all unremarkable.  Her CT scan did show an abnormality in the area of your cervix which should be evaluated by your gynecologist.  Please contact her gynecologist and arrange for a followup appointment.  This abnormality may be nothing serious but we cannot completely rule out a cervical cancer.

## 2011-06-14 NOTE — ED Provider Notes (Signed)
History     CSN: [id number]  Arrival date & time 06/14/11  1328   First MD Initiated Contact with Patient 06/14/11 1513      Chief Complaint  Patient presents with  . Abdominal Pain    pt c/o generalized abd pain that began the beginning of this month with mid back pain. pt denies pregnancy states tubes have been tied. pt denies n/v/d.   Marland Kitchen Bloated    (Consider location/radiation/quality/duration/timing/severity/associated sxs/prior treatment) HPI pt c/o generalized abd pain that began the beginning of this month with mid back pain. pt denies pregnancy states tubes have been tied. pt denies n/v/d.  History reviewed. No pertinent past medical history.  Past Surgical History  Procedure Date  . Cholecystectomy   . Tubal ligation     History reviewed. No pertinent family history.  History  Substance Use Topics  . Smoking status: Never Smoker   . Smokeless tobacco: Not on file  . Alcohol Use: No    OB History    Grav Para Term Preterm Abortions TAB SAB Ect Mult Living                  Review of Systems  All other systems reviewed and are negative.    Allergies  Review of patient's allergies indicates no known allergies.  Home Medications   Current Outpatient Rx  Name Route Sig Dispense Refill  . ADULT MULTIVITAMIN W/MINERALS CH Oral Take 1 tablet by mouth daily.      Marland Kitchen HYDROCODONE-ACETAMINOPHEN 5-325 MG PO TABS Oral Take 2 tablets by mouth every 4 (four) hours as needed for pain. 6 tablet 0    BP 127/79  Pulse 73  Temp(Src) 98.5 F (36.9 C) (Oral)  Resp 20  Wt 200 lb (90.719 kg)  SpO2 100%  LMP 06/06/2011  Physical Exam  Nursing note and vitals reviewed. Constitutional: She is oriented to person, place, and time. She appears well-developed and well-nourished. No distress.  HENT:  Head: Normocephalic and atraumatic.  Eyes: Pupils are equal, round, and reactive to light.  Neck: Normal range of motion.  Cardiovascular: Normal rate and intact distal  pulses.   Pulmonary/Chest: No respiratory distress.  Abdominal: Soft. Normal appearance and bowel sounds are normal. She exhibits distension. She exhibits no mass. There is no tenderness. There is no rebound and no guarding.  Musculoskeletal: Normal range of motion.  Neurological: She is alert and oriented to person, place, and time. No cranial nerve deficit.  Skin: Skin is warm and dry. No rash noted.  Psychiatric: She has a normal mood and affect. Her behavior is normal.    ED Course  Procedures (including critical care time)  Labs Reviewed  URINALYSIS, ROUTINE W REFLEX MICROSCOPIC - Abnormal; Notable for the following:    APPearance CLOUDY (*)    Leukocytes, UA TRACE (*)    All other components within normal limits  CBC - Abnormal; Notable for the following:    WBC 11.6 (*)    MCH 25.6 (*)    All other components within normal limits  DIFFERENTIAL - Abnormal; Notable for the following:    Neutro Abs 8.7 (*)    All other components within normal limits  COMPREHENSIVE METABOLIC PANEL  LIPASE, BLOOD  POCT PREGNANCY, URINE  URINE MICROSCOPIC-ADD ON  LAB REPORT - SCANNED   Ct Abdomen Pelvis W Contrast  06/14/2011  *RADIOLOGY REPORT*  Clinical Data: 40 year old female with abdominal and pelvic pain.  CT ABDOMEN AND PELVIS WITH CONTRAST  Technique:  Multidetector CT imaging of the abdomen and pelvis was performed following the standard protocol during bolus administration of intravenous contrast.  Contrast: OMNIPAQUE IOHEXOL 300 MG/ML IV SOLN  Comparison: 04/09/2007 CT  Findings: The liver, spleen, kidneys, adrenal glands and pancreas are unremarkable. Cholecystectomy identified.  No free fluid, enlarged lymph nodes, biliary dilation or abdominal aortic aneurysm identified. The bowel and bladder are within normal limits. The appendix is normal.  The cervix is very prominent measuring 4.8 x 6.1 cm (image 62.  A cervical mass is not excluded and further evaluation is recommended. No  acute or  No acute or suspicious bony abnormalities are identified.  IMPRESSION: Very prominent cervix - cervical mass/neoplasm is not excluded and further evaluation recommended.  No other significant abnormalities identified.  Original Report Authenticated By: Rosendo Gros, M.D.     1. Cervical mass       MDM   Results discussed with pt.  Will follow with her gyn doctor in near future.         Nelia Shi, MD 06/15/11 [phone]

## 2011-08-08 ENCOUNTER — Emergency Department (HOSPITAL_COMMUNITY)
Admission: EM | Admit: 2011-08-08 | Discharge: 2011-08-08 | Disposition: A | Discharge status: Home Health | Payer: Medicaid Other | Source: Home / Self Care | Attending: Emergency Medicine | Admitting: Emergency Medicine

## 2011-08-08 ENCOUNTER — Encounter (HOSPITAL_COMMUNITY): Payer: Self-pay | Admitting: Emergency Medicine

## 2011-08-08 DIAGNOSIS — R109 Unspecified abdominal pain: Secondary | ICD-10-CM

## 2011-08-08 LAB — POCT URINALYSIS DIP (DEVICE)
Glucose, UA: NEGATIVE mg/dL
Ketones, ur: NEGATIVE mg/dL
Specific Gravity, Urine: 1.02 (ref 1.005–1.030)
Urobilinogen, UA: 0.2 mg/dL (ref 0.0–1.0)

## 2011-08-08 LAB — POCT PREGNANCY, URINE: Preg Test, Ur: NEGATIVE

## 2011-08-08 MED ORDER — NITROFURANTOIN MONOHYD MACRO 100 MG PO CAPS: 100.0000 mg | ORAL_CAPSULE | Freq: Two times a day (BID) | ORAL | Status: AC

## 2011-08-08 NOTE — ED Notes (Signed)
PT HERE WITH PROGRESSIVE ABDOMINAL BLOATING/DISTENTION SINCE JAN.13/2013.PT STATES SHE ALSO FEELS THERES FETAL MOVEMENT/FLURRIES BUT S/P TUBAL LIGATION.REPORTS OF VOMITING 4 TIMES LAST Friday.NO CONSTIPATION OR PAIN NOTED.PT ALSO S/P CERVICAL CYST REMOVAL LAST MNTH.LMP 07/27/11.

## 2011-08-08 NOTE — Discharge Instructions (Signed)
  As discussed during your exam, your current abdominal exam does not exhibit any signs of an acute emergent and or surgical condition. Have recommend that you followup with your primary care Dr. within the next 1-2 weeks for further evaluation. Your urine had some he regular results not specific. As you are experiencing also lower abdominal discomfort we will send your urine sample for cultures to further rule out a potential urinary tract infection or bladder colonization. Take this medicine for 3 days and we obtain the culture results. Today called your Dr. to schedule a followup appointment. If abdominal pain localizes, exacerbates or or new symptoms like vomiting with fevers. Should go to the emergency department for further evaluation    Abdominal Pain Abdominal pain can be caused by many things. Your caregiver decides the seriousness of your pain by an examination and possibly blood tests and X-rays. Many cases can be observed and treated at home. Most abdominal pain is not caused by a disease and will probably improve without treatment. However, in many cases, more time must pass before a clear cause of the pain can be found. Before that point, it may not be known if you need more testing, or if hospitalization or surgery is needed. HOME CARE INSTRUCTIONS   Do not take laxatives unless directed by your caregiver.   Take pain medicine only as directed by your caregiver.   Only take over-the-counter or prescription medicines for pain, discomfort, or fever as directed by your caregiver.   Try a clear liquid diet (broth, tea, or water) for as long as directed by your caregiver. Slowly move to a bland diet as tolerated.  SEEK IMMEDIATE MEDICAL CARE IF:   The pain does not go away.   You have a fever.   You keep throwing up (vomiting).   The pain is felt only in portions of the abdomen. Pain in the right side could possibly be appendicitis. In an adult, pain in the left lower portion of the  abdomen could be colitis or diverticulitis.   You pass bloody or black tarry stools.  MAKE SURE YOU:   Understand these instructions.   Will watch your condition.   Will get help right away if you are not doing well or get worse.  Document Released: 03/14/2005 Document Revised: 02/14/2011 Document Reviewed: 01/21/2008 [facility] Patient Information 2012 [place], [place].

## 2011-08-08 NOTE — ED Provider Notes (Signed)
History     CSN: [id number]  Arrival date & time 08/08/11  0828   First MD Initiated Contact with Patient 08/08/11 [phone]      Chief Complaint  Patient presents with  . Bloated    (Consider location/radiation/quality/duration/timing/severity/associated sxs/prior treatment) HPI Comments: Patient presents urgent care complaining of nonspecific ongoing (sporadic and recurrent) bloating and sensation of movement as when someone is pregnant on her upper abdomen and lower abdomen. Describes about a week ago she vomited about 3-4x1 single day, with no diarrheas, no fevers, no significant abdominal pain.  Patient describes have been feeling the sensation of bloating and distention since the beginning of January but comes and goes, was somewhat concerned that despite having a tubal ligation wanted to make sure that she was she is not pregnant.  Patient describes no changes in her bowel habits, no diarrheas: No constipation, no rectal pain.  The history is provided by the patient.    History reviewed. No pertinent past medical history.  Past Surgical History  Procedure Date  . Cholecystectomy   . Tubal ligation   . Cervial cyst     No family history on file.  History  Substance Use Topics  . Smoking status: Never Smoker   . Smokeless tobacco: Not on file  . Alcohol Use: No    OB History    Grav Para Term Preterm Abortions TAB SAB Ect Mult Living                  Review of Systems  Constitutional: Negative for fever, chills, activity change, appetite change and fatigue.  HENT: Negative for facial swelling.   Respiratory: Negative for shortness of breath.   Gastrointestinal: Positive for abdominal distention. Negative for nausea, abdominal pain, diarrhea, constipation, blood in stool, anal bleeding and rectal pain.  Genitourinary: Negative for dysuria, frequency and pelvic pain.    Allergies  Review of patient's allergies indicates no known allergies.  Home Medications    Current Outpatient Rx  Name Route Sig Dispense Refill  . ADULT MULTIVITAMIN W/MINERALS CH Oral Take 1 tablet by mouth daily.      Marland Kitchen NITROFURANTOIN MONOHYD MACRO 100 MG PO CAPS Oral Take 1 capsule (100 mg total) by mouth 2 (two) times daily. 6 capsule 0    BP 130/79  Pulse 77  Temp(Src) 98.7 F (37.1 C) (Oral)  Resp 18  SpO2 98%  LMP 07/27/2011  Physical Exam  Nursing note and vitals reviewed. Constitutional: She appears well-developed and well-nourished. No distress.  HENT:  Head: Normocephalic.  Mouth/Throat: No oropharyngeal exudate.  Eyes: Conjunctivae are normal. No scleral icterus.  Neck: Neck supple.  Pulmonary/Chest: Breath sounds normal. No respiratory distress. She has no decreased breath sounds. She has no wheezes. She has no rhonchi. She has no rales.  Abdominal: Soft. Bowel sounds are normal. She exhibits no distension, no fluid wave, no ascites and no mass. There is hepatomegaly. There is no hepatosplenomegaly or splenomegaly. There is no tenderness. There is no rigidity, no rebound, no guarding, no tenderness at McBurney's point and negative Murphy's sign. No hernia.    Skin: No erythema.    ED Course  Procedures (including critical care time)  Labs Reviewed  POCT URINALYSIS DIP (DEVICE) - Abnormal; Notable for the following:    Hgb urine dipstick TRACE (*)    Leukocytes, UA SMALL (*) Biochemical Testing Only. Please order routine urinalysis from main lab if confirmatory testing is needed.   All other components within normal limits  POCT PREGNANCY, URINE  URINE CULTURE   No results found.   1. Abdominal discomfort       MDM  Patient presents urgent care describing some nonspecific and vague abdominal discomforts (intermittent in nature) bloating or distention sensation for several weeks. Last week had 3-4 episodes of vomiting which resolved and have not had any further symptoms as in nausea, vomiting, dysuria, fever or any other  symptoms.      Only) ovulation date [place] that I have ruled out helping her  Jimmie Molly, MD 08/08/11 [phone]

## 2011-08-09 LAB — URINE CULTURE: Colony Count: 10000

## 2011-08-17 ENCOUNTER — Ambulatory Visit: Payer: Self-pay | Admitting: Obstetrics and Gynecology

## 2011-09-05 ENCOUNTER — Ambulatory Visit (INDEPENDENT_AMBULATORY_CARE_PROVIDER_SITE_OTHER): Payer: Medicaid Other | Admitting: Obstetrics and Gynecology

## 2011-09-05 DIAGNOSIS — N949 Unspecified condition associated with female genital organs and menstrual cycle: Secondary | ICD-10-CM

## 2011-09-05 DIAGNOSIS — E569 Vitamin deficiency, unspecified: Secondary | ICD-10-CM

## 2011-09-05 DIAGNOSIS — N390 Urinary tract infection, site not specified: Secondary | ICD-10-CM

## 2011-11-19 ENCOUNTER — Ambulatory Visit (INDEPENDENT_AMBULATORY_CARE_PROVIDER_SITE_OTHER): Payer: Medicaid Other | Admitting: Obstetrics and Gynecology

## 2011-11-19 ENCOUNTER — Encounter: Payer: Self-pay | Admitting: Obstetrics and Gynecology

## 2011-11-19 VITALS — BP 112/70 | Resp 14 | Ht 63.0 in | Wt 208.0 lb

## 2011-11-19 DIAGNOSIS — N949 Unspecified condition associated with female genital organs and menstrual cycle: Secondary | ICD-10-CM

## 2011-11-19 DIAGNOSIS — R102 Pelvic and perineal pain: Secondary | ICD-10-CM

## 2011-11-19 DIAGNOSIS — M545 Low back pain: Secondary | ICD-10-CM

## 2011-11-19 LAB — POCT URINALYSIS DIPSTICK
Bilirubin, UA: NEGATIVE
Spec Grav, UA: 1.015

## 2011-11-19 NOTE — Progress Notes (Signed)
Still c/o something moving in abdomen, denies urinary sxs CT scan reviewed again  Filed Vitals:   11/19/11 0937  BP: 112/70  Resp: 14   ROS: noncontributory  Abdomen: decreased bowel sounds Pelvic exam:  VULVA: normal appearing vulva with no masses, tenderness or lesions,  VAGINA: normal appearing vagina with normal color and discharge, no lesions, CERVIX: normal appearing cervix without discharge or lesions, prominent with ?nabothian cysts and vascularity UTERUS: uterus is normal size, shape, consistency and nontender,  ADNEXA: normal adnexa in size, nontender and no masses.  A/P Refer to PCP Discuss with Gyn Onc any further recs or whether they should see for second opinion/consultation

## 2011-11-20 ENCOUNTER — Telehealth: Payer: Self-pay | Admitting: Obstetrics and Gynecology

## 2011-11-20 LAB — URINE CULTURE

## 2011-11-20 NOTE — Telephone Encounter (Signed)
TC to GYn ONC.  Spoke with Melissa . Informed of SR AR note and findings on exam.   Will review with Dr Nelly Rout and call with recommendation.

## 2011-11-20 NOTE — Telephone Encounter (Signed)
Message received from [employer].   Pt has been scheduled with DR Nelly Rout 12/04/11 at 10:30, arrive 10:00.   TC to pt. Informed of appt.   States also has F/U appt with Dr Concepcion Elk scheduled.

## 2011-11-20 NOTE — Telephone Encounter (Signed)
Message copied by Mason Jim on Tue Nov 20, 2011 11:54 AM ------      Message from: Osborn Coho      Created: Mon Nov 19, 2011  1:54 PM       This pt has been c/o of the same symptom for awhile now, she feels something moving in her belly, pressure and low back discomfort.  Her CT Scan was unremarkable except for a prominent cervix, can't r/o neoplasm.  Her last pap was neg in Jan 2013.  Please contact Harriett Sine with Dr. Nelwyn [facility]bury group and ask her for any additional recs re: this pt's cervix.  If they would like to see her for eval, please schedule.  Also pt needs appt with Dr. Allyne Gee or Dr. Tyson Dense ASAP.  Thank you

## 2011-12-04 ENCOUNTER — Encounter: Payer: Self-pay | Admitting: Gynecologic Oncology

## 2011-12-04 ENCOUNTER — Ambulatory Visit: Payer: Medicaid Other | Attending: Gynecologic Oncology | Admitting: Gynecologic Oncology

## 2011-12-04 VITALS — BP 104/60 | HR 84 | Temp 98.3°F | Resp 18 | Ht 62.32 in | Wt 212.5 lb

## 2011-12-04 DIAGNOSIS — N949 Unspecified condition associated with female genital organs and menstrual cycle: Secondary | ICD-10-CM | POA: Insufficient documentation

## 2011-12-04 DIAGNOSIS — R102 Pelvic and perineal pain: Secondary | ICD-10-CM | POA: Insufficient documentation

## 2011-12-04 NOTE — Progress Notes (Signed)
Consult Note: Gyn-Onc  Consult was requested by Dr. Su Hilt for the evaluation of [name] 41 y.o. female  CC:  Chief Complaint  Patient presents with  . Pelvic Pain    New consult    HPI: This is a 41 year old gravida 4 para 3 Patient's last menstrual period was 11/27/2011. who reports a feeling of fluttering similar to the feeling of a baby moving within the uterus approximately 6 months. The  feelings are continuous and associated with intermittent sharp like pain located in the right lower quadrant. She states the pain is not cyclic and not aggravated or precipitated by any particular dietary intake. She denies a history of inflammatory bowel disease. There's been no weight loss actually there's been approximately 10 pound weight gain.   The patient had numerous negative  pregnancy tests.  The patient is aware that she is not pregnant and is not desirous of being pregnant . A CT of the abdomen and pelvis was only notable for 4.8 x 6.1 cm cervix however no description as provided about the uterus. The patient reports that there had been endocervical curettages that are within normal limits and that her last Pap test was normal. I am unable to confirm this on the electronic medical record.  Performance status: 0 Review of Systems:  Constitutional  Feels well,  Cardiovascular  No chest pain, shortness of breath, or edema  Pulmonary  No cough or wheeze.  Gastro Intestinal  No nausea, vomitting, or diarrhoea. No bright red blood per rectum, no abdominal pain, change in bowel movement, or constipation.  Genito Urinary  No frequency, urgency, dysuria, no vaginal bleeding Musculo Skeletal  No myalgia, arthralgia, joint swelling or pain  Neurologic  No weakness, numbness, change in gait,  Psychology  No depression, anxiety, insomnia.    Current Meds:  Outpatient Encounter Prescriptions as of 12/04/2011  Medication Sig Dispense Refill  . Multiple Vitamin (MULITIVITAMIN WITH  MINERALS) TABS Take 1 tablet by mouth daily.        . Multiple Vitamin (MULTIVITAMIN) tablet Take 1 tablet by mouth daily.      . Vitamin D, Ergocalciferol, (DRISDOL) 50000 UNITS CAPS Take 50,000 Units by mouth.        Allergy: No Known Allergies  Social Hx:   History   Social History  . Marital Status: Single    Spouse Name: N/A    Number of Children: N/A  . Years of Education: N/A   Occupational History  . Not on file.   Social History Main Topics  . Smoking status: Never Smoker   . Smokeless tobacco: Not on file  . Alcohol Use: No  . Drug Use: No  . Sexually Active: Yes   Other Topics Concern  . Not on file   Social History Narrative  . No narrative on file    Past Surgical Hx:  Past Surgical History  Procedure Date  . Cholecystectomy   . Tubal ligation   . Cervial cyst     Past Medical Hx: History reviewed. No pertinent past medical history.  Past Gynecological History:  [id number].Normal regular menses  Family Hx: History reviewed. No pertinent family history.  Vitals:  Blood pressure 104/60, pulse 84, temperature 98.3 F (36.8 C), temperature source Oral, resp. rate 18, height 5' 2.32" (1.583 m), weight 212 lb 8 oz (96.389 kg), last menstrual period 11/27/2011.  Physical Exam: WD in NAD Neck  Supple NROM, without any enlargements.  Lymph Node Survey No cervical supraclavicular or inguinal  adenopathy Cardiovascular  Pulse normal rate, regularity and rhythm. Lungs  Clear to auscultation bilateraly, without wheezes/crackles/rhonchi.  Skin  No rash/lesions/breakdown  Psychiatry  Alert and oriented to person, place, and time  Abdomen  Normoactive bowel sounds, abdomen soft, non-tender and obese. No palpable masses no evidence of ascites no guarding  Back No CVA tenderness Genito Urinary  Vulva/vagina: Normal external female genitalia.  No lesions. No discharge or bleeding.  Bladder/urethra:  No lesions or masses  Vagina: Well estrogenized without any  lesion   Cervix: Approximately 5 cm prominent cervical ectropion  Uterus: Unable to assess the size because of her habitus,  mobile, no parametrial involvement or nodularity.  Adnexa: No palpable masses. Rectal  Good tone, no masses no cul de sac nodularity.  Extremities  No bilateral cyanosis, clubbing or edema.   Assessment/Plan:  Ms. [name]  is a 41 y.o.  year old referred for intermittent complaints of abdominal fluttering. Review of the CT scan is notable for the absence of a description of the uterus. I recommended to the patient that she followup with Dr. Su Hilt and Dr. Molly Maduro consider doing an abdominal ultrasound to exclude the possibility of a pedunculated fibroid that perhaps moves.  The cervix was evaluated and it is large. I am unable to access the reports of the Pap tests are endocervical curettages. The patient's very articulate and states that they were completely normal so I trust that the case.  I would recommend routine surveillance.   Laurette Schimke, MD, PhD 12/04/2011, 5:32 PM

## 2011-12-04 NOTE — Patient Instructions (Signed)
With Dr. Su Hilt for consideration of a pelvic ultrasound

## 2011-12-25 ENCOUNTER — Telehealth: Payer: Self-pay

## 2011-12-25 NOTE — Telephone Encounter (Signed)
Spoke to pt and got her sched for follow up w/ AR to review Dr. Forrestine Him rec's.  Appt 01/03/2012. Melody Comas A

## 2012-01-01 ENCOUNTER — Emergency Department (HOSPITAL_COMMUNITY)
Admission: EM | Admit: 2012-01-01 | Discharge: 2012-01-02 | Disposition: A | Discharge status: Home / Self Care | Payer: Medicaid Other | Attending: Emergency Medicine | Admitting: Emergency Medicine

## 2012-01-01 ENCOUNTER — Encounter (HOSPITAL_COMMUNITY): Payer: Self-pay | Admitting: *Deleted

## 2012-01-01 DIAGNOSIS — R109 Unspecified abdominal pain: Secondary | ICD-10-CM | POA: Insufficient documentation

## 2012-01-01 DIAGNOSIS — R10817 Generalized abdominal tenderness: Secondary | ICD-10-CM | POA: Insufficient documentation

## 2012-01-01 LAB — URINALYSIS, ROUTINE W REFLEX MICROSCOPIC
Bilirubin Urine: NEGATIVE
Ketones, ur: NEGATIVE mg/dL
Nitrite: NEGATIVE
Protein, ur: NEGATIVE mg/dL
Urobilinogen, UA: 0.2 mg/dL (ref 0.0–1.0)
pH: 7.5 (ref 5.0–8.0)

## 2012-01-01 LAB — COMPREHENSIVE METABOLIC PANEL
ALT: 18 U/L (ref 0–35)
Alkaline Phosphatase: 66 U/L (ref 39–117)
BUN: 9 mg/dL (ref 6–23)
CO2: 26 mEq/L (ref 19–32)
Chloride: 98 mEq/L (ref 96–112)
GFR calc Af Amer: 89 mL/min — ABNORMAL LOW (ref >90)
Glucose, Bld: 99 mg/dL (ref 70–99)
Potassium: 3.9 mEq/L (ref 3.5–5.1)
Sodium: 132 mEq/L — ABNORMAL LOW (ref 135–145)
Total Bilirubin: 0.4 mg/dL (ref 0.3–1.2)

## 2012-01-01 LAB — CBC WITH DIFFERENTIAL/PLATELET
Basophils Absolute: 0 10*3/uL (ref 0.0–0.1)
Basophils Relative: 0 % (ref 0–1)
Eosinophils Relative: 1 % (ref 0–5)
HCT: 41.1 % (ref 36.0–46.0)
Lymphocytes Relative: 20 % (ref 12–46)
MCHC: 33.3 g/dL (ref 30.0–36.0)
Monocytes Absolute: 0.5 10*3/uL (ref 0.1–1.0)
Neutro Abs: 8.3 10*3/uL — ABNORMAL HIGH (ref 1.7–7.7)
Platelets: 314 10*3/uL (ref 150–400)
RDW: 14.7 % (ref 11.5–15.5)
WBC: 11.1 10*3/uL — ABNORMAL HIGH (ref 4.0–10.5)

## 2012-01-01 NOTE — ED Provider Notes (Signed)
History     CSN: [id number]  Arrival date & time 01/01/12  2032   First MD Initiated Contact with Patient 01/01/12 2259      Chief Complaint  Patient presents with  . Abdominal Pain    (Consider location/radiation/quality/duration/timing/severity/associated sxs/prior treatment) HPI Comments: Patient comes in today with a chief complaint of abdominal pain.  She reports that she has had this pain intermittently over the past 6 months.  She describes the pain as "contractions."  She reports that at times the pain is located on the RLQ and at times it is located on the LLQ.  She has had a CT for this pain in the past.  CT showed mildly enlarged cervix.  She has been seen by her PCP and by OB/GYN for this same pain.  OB/GYN has set her up with an outpatient ultrasound, but she has not had this yet.  She is not having any pain at this time.  Patient is a 41 y.o. female presenting with abdominal pain. The history is provided by the patient.  Abdominal Pain The primary symptoms of the illness include abdominal pain. The primary symptoms of the illness do not include fever, nausea, vomiting, diarrhea, hematemesis, hematochezia, dysuria, vaginal discharge or vaginal bleeding.  The patient states that she believes she is currently not pregnant. The patient has not had a change in bowel habit. Symptoms associated with the illness do not include chills, constipation, urgency, hematuria or frequency.    Past Medical History  Diagnosis Date  . History of chicken pox   . History of rubella   . Anemia   . Menorrhagia   . Increased abdominal girth     Past Surgical History  Procedure Date  . Cholecystectomy   . Tubal ligation   . Cervial cyst     History reviewed. No pertinent family history.  History  Substance Use Topics  . Smoking status: Never Smoker   . Smokeless tobacco: Not on file  . Alcohol Use: No    OB History    Grav Para Term Preterm Abortions TAB SAB Ect Mult Living   4  3   1            Review of Systems  Constitutional: Negative for fever and chills.  Cardiovascular: Negative for chest pain.  Gastrointestinal: Positive for abdominal pain. Negative for nausea, vomiting, diarrhea, constipation, blood in stool, hematochezia and hematemesis.  Genitourinary: Negative for dysuria, urgency, frequency, hematuria, flank pain, vaginal bleeding, vaginal discharge, difficulty urinating and vaginal pain.  All other systems reviewed and are negative.    Allergies  Review of patient's allergies indicates no known allergies.  Home Medications   Current Outpatient Rx  Name Route Sig Dispense Refill  . IBUPROFEN 200 MG PO TABS Oral Take 400 mg by mouth every 6 (six) hours as needed. For pain.    . ADULT MULTIVITAMIN W/MINERALS CH Oral Take 1 tablet by mouth daily.        BP 119/71  Pulse 93  Temp 98.3 F (36.8 C) (Oral)  Resp 18  SpO2 100%  LMP 11/01/2011  Physical Exam  Nursing note and vitals reviewed. Constitutional: She appears well-developed and well-nourished. No distress.  HENT:  Head: Normocephalic and atraumatic.  Mouth/Throat: Oropharynx is clear and moist.  Eyes: EOM are normal. Pupils are equal, round, and reactive to light.  Neck: Normal range of motion. Neck supple.  Cardiovascular: Normal rate, regular rhythm and normal heart sounds.   Pulmonary/Chest: Effort normal and  breath sounds normal.  Abdominal: Soft. Bowel sounds are normal. She exhibits no distension and no mass. There is generalized tenderness. There is no rigidity, no rebound, no guarding, no CVA tenderness, no tenderness at McBurney's point and negative Murphy's sign.       Mild generalized abdominal pain  Genitourinary:       Patient declined  Musculoskeletal: Normal range of motion.  Neurological: She is alert.  Skin: Skin is warm and dry. She is not diaphoretic.  Psychiatric: She has a normal mood and affect.    ED Course  Procedures (including critical care  time)  Labs Reviewed  URINALYSIS, ROUTINE W REFLEX MICROSCOPIC - Abnormal; Notable for the following:    Leukocytes, UA MODERATE (*)     All other components within normal limits  URINE MICROSCOPIC-ADD ON - Abnormal; Notable for the following:    Squamous Epithelial / LPF FEW (*)     All other components within normal limits  CBC WITH DIFFERENTIAL - Abnormal; Notable for the following:    WBC 11.1 (*)     RBC 5.13 (*)     Neutro Abs 8.3 (*)     All other components within normal limits  POCT PREGNANCY, URINE  COMPREHENSIVE METABOLIC PANEL   No results found.   No diagnosis found.    MDM  Patient presenting with intermittent generalized abdominal pain for the past 6 months.  She has been evaluated by PCP and OB/GYN for this.  She had a CT scan of her abdomen in the past, which showed an enlarged cervix but no other abnormalities.  She has an upcoming outpatient abdominal ultrasound scheduled.  Patient afebrile.  No nausea or vomiting. No rebound or guarding on abdominal exam.    Patient declined pelvic exam.  She states that OB/GYN recently performed this.  Therefore, feel that patient can be discharged home and have her outpatient ultrasound.        Pascal Lux [place], PA-C 01/03/12 2353

## 2012-01-01 NOTE — ED Notes (Signed)
Pt c/o generalized abd pain x's 5 days. Denies n/v/d. States "funny feeling in stomach." unsure about pregnancy, took home pregnancy test with neg results.

## 2012-01-02 MED ORDER — DICYCLOMINE HCL 20 MG PO TABS: 20.0000 mg | ORAL_TABLET | Freq: Two times a day (BID) | ORAL | Status: DC

## 2012-01-03 ENCOUNTER — Other Ambulatory Visit: Payer: Self-pay | Admitting: Obstetrics and Gynecology

## 2012-01-03 ENCOUNTER — Ambulatory Visit (INDEPENDENT_AMBULATORY_CARE_PROVIDER_SITE_OTHER): Payer: Medicaid Other | Admitting: Obstetrics and Gynecology

## 2012-01-03 ENCOUNTER — Encounter: Payer: Self-pay | Admitting: Obstetrics and Gynecology

## 2012-01-03 ENCOUNTER — Ambulatory Visit (INDEPENDENT_AMBULATORY_CARE_PROVIDER_SITE_OTHER): Payer: Medicaid Other

## 2012-01-03 VITALS — BP 108/72 | Resp 16 | Ht 62.0 in | Wt 212.0 lb

## 2012-01-03 DIAGNOSIS — D259 Leiomyoma of uterus, unspecified: Secondary | ICD-10-CM

## 2012-01-03 NOTE — Progress Notes (Signed)
Ultrasound today uterus 8.6 x 5.6 x 5.0 cm bilateral ovaries within normal limits no visible fibroids or masses seen No complaints.  Now that pt has seen gyn onc and had u/s, she said she's gonna just leave it alone and not pursue the "the something is moving around in her belly" anymore.  Filed Vitals:   01/03/12 1401  BP: 108/72  Resp: 16   A/P Reviewed nl u/s Questions answered AEX due in January

## 2012-01-04 NOTE — ED Provider Notes (Signed)
Medical screening examination/treatment/procedure(s) were performed by non-physician practitioner and as supervising physician I was immediately available for consultation/collaboration.  Flint Melter, MD 01/04/12 [phone]

## 2012-04-14 ENCOUNTER — Other Ambulatory Visit: Payer: Medicaid Other

## 2012-11-20 ENCOUNTER — Other Ambulatory Visit: Payer: Self-pay | Admitting: Obstetrics and Gynecology

## 2012-11-20 DIAGNOSIS — Z1231 Encounter for screening mammogram for malignant neoplasm of breast: Secondary | ICD-10-CM

## 2012-12-18 ENCOUNTER — Ambulatory Visit
Admission: RE | Admit: 2012-12-18 | Discharge: 2012-12-18 | Disposition: A | Discharge status: Home / Self Care | Payer: Medicaid Other | Source: Ambulatory Visit | Attending: Obstetrics and Gynecology | Admitting: Obstetrics and Gynecology

## 2012-12-18 DIAGNOSIS — Z1231 Encounter for screening mammogram for malignant neoplasm of breast: Secondary | ICD-10-CM

## 2012-12-23 ENCOUNTER — Other Ambulatory Visit: Payer: Self-pay | Admitting: Obstetrics and Gynecology

## 2012-12-23 DIAGNOSIS — R928 Other abnormal and inconclusive findings on diagnostic imaging of breast: Secondary | ICD-10-CM

## 2013-01-02 ENCOUNTER — Other Ambulatory Visit: Payer: Medicaid Other

## 2013-01-12 ENCOUNTER — Other Ambulatory Visit: Payer: Self-pay | Admitting: Obstetrics and Gynecology

## 2013-01-13 ENCOUNTER — Other Ambulatory Visit (HOSPITAL_COMMUNITY): Payer: Self-pay | Admitting: Obstetrics and Gynecology

## 2013-01-14 ENCOUNTER — Other Ambulatory Visit: Payer: Self-pay | Admitting: Obstetrics and Gynecology

## 2013-01-14 NOTE — H&P (Signed)
  [name] is a 42 y.o. female, P 3-0-1-3 presenting for diagnostic laparoscopy because of pelvic pain.  Over the past year the patient will experience random sharp shooting pelvic pain several times a month.  These episodes will last from 10 minutes to 12 hours and is not made worse by any known activity but states that walking seems to help some.  She has chosen not to take analgesia for the pain.  Her menses is monthly lasting for 5 days with a pad change 3 times a day and no cramping. She denies urinary or bowel symptoms but occasionally will have dyspareunia and lower back pain.  A pelvic ultrasound done early July showed, uterus-7.4 x 6.7 x 5.47 cm with right ovary-2.90 x 1.87 x 1.85 cm and left ovary-3.52 x 2.17 x 1.96.  No cul-de-sac fluid or adnexal abnormalities seen. Tests for gonorrhea and chlamydia were negative.  Given the protracted nature of her symptoms, the patient has decided to proceed with diagnostic laparoscopy for further evaluation.   Past Medical History  OB History: G4 P 3-0-1-3 SVD 1993, 2004 and 2006  GYN History: menarche: 42 YO;    LMP: 11/29/2012;    Contracepton bilateral tubal ligation  The patient denies history of sexually transmitted disease.  Denies history of abnormal PAP smear  Last PAP smear  Medical History: Anemia, menorrhagia, vitamin D deficiency  Surgical History: 2006 Tubal Sterilization;   2009 Cholecystectomy Denies problems with anesthesia or history of blood transfusions  Family History: Heart disease, asthma, hypertension, diabetes mellitus, migraines, stroke  Social History:  Single and unemployed; Denies alcohol, tobacco or illicit drug use  Medications: Multivitamins daily  No Known Allergies  Denies sensitivity to peanuts, shellfish, soy, latex or adhesives.    ROS: Admits to glasses but  denies headache, vision changes, nasal congestion, dysphagia, tinnitus, dizziness, hoarseness, cough,  chest pain, shortness of breath, nausea,  vomiting, diarrhea,constipation,  urinary frequency, urgency  dysuria, hematuria, vaginitis symptoms, swelling of joints,easy bruising,  myalgias, arthralgias, skin rashes, unexplained weight loss and except as is mentioned in the history of present illness, patient's review of systems is otherwise negative.  Physical Exam  Bp-118/72   P-60  R-12   Temp.-98.5 degrees F orally   Weight-214 lbs  Height-5'3"  BMI-37.9  Neck: supple without masses or thyromegaly Lungs: clear to auscultation Heart: regular rate and rhythm Abdomen: soft, non-tender and no organomegaly Pelvic:EGBUS- wnl; vagina-normal rugae; uterus-normal size, cervix without lesions or motion tenderness; adnexae-no tenderness or masses Extremities:  no clubbing, cyanosis or edema   Assesment:  Pelvic Pain   Disposition:  A discussion was held with patient regarding the indication for her procedure(s) along with the risks, which include but are not limited to: reaction to anesthesia, damage to adjacent organs, infection and excessive bleeding. The patient verbalized understanding of these risks and has consented to proceed with Diagnostic Laparoscopy with Chromopertubation at [facility] on  January 22, 2013 at 2 p. m.   CSN# [id number]   Zeric Baranowski J. Cloey Sferrazza, PA-C  for Dr. Angela Y.Roberts    

## 2013-01-19 ENCOUNTER — Encounter (HOSPITAL_COMMUNITY): Payer: Self-pay | Admitting: Pharmacy Technician

## 2013-01-22 ENCOUNTER — Encounter (HOSPITAL_COMMUNITY): Payer: Self-pay | Admitting: *Deleted

## 2013-01-22 ENCOUNTER — Ambulatory Visit (HOSPITAL_COMMUNITY)
Admission: RE | Admit: 2013-01-22 | Discharge: 2013-01-22 | Disposition: A | Discharge status: Home / Self Care | Payer: Medicaid Other | Source: Ambulatory Visit | Attending: Obstetrics and Gynecology | Admitting: Obstetrics and Gynecology

## 2013-01-22 ENCOUNTER — Encounter (HOSPITAL_COMMUNITY): Payer: Self-pay | Admitting: Anesthesiology

## 2013-01-22 ENCOUNTER — Ambulatory Visit (HOSPITAL_COMMUNITY): Payer: Medicaid Other | Admitting: Anesthesiology

## 2013-01-22 ENCOUNTER — Encounter (HOSPITAL_COMMUNITY)
Admission: RE | Disposition: A | Discharge status: Home / Self Care | Payer: Self-pay | Source: Ambulatory Visit | Attending: Obstetrics and Gynecology

## 2013-01-22 DIAGNOSIS — IMO0002 Reserved for concepts with insufficient information to code with codable children: Secondary | ICD-10-CM | POA: Insufficient documentation

## 2013-01-22 DIAGNOSIS — N803 Endometriosis of pelvic peritoneum, unspecified: Secondary | ICD-10-CM | POA: Insufficient documentation

## 2013-01-22 DIAGNOSIS — N949 Unspecified condition associated with female genital organs and menstrual cycle: Secondary | ICD-10-CM | POA: Insufficient documentation

## 2013-01-22 HISTORY — PX: CHROMOPERTUBATION: SHX6288

## 2013-01-22 HISTORY — PX: LAPAROSCOPY: SHX197

## 2013-01-22 LAB — BASIC METABOLIC PANEL
CO2: 24 mEq/L (ref 19–32)
Calcium: 9.7 mg/dL (ref 8.4–10.5)
Chloride: 105 mEq/L (ref 96–112)
Creatinine, Ser: 0.75 mg/dL (ref 0.50–1.10)
GFR calc Af Amer: >90 mL/min (ref >90)
Sodium: 139 mEq/L (ref 135–145)

## 2013-01-22 LAB — HCG, SERUM, QUALITATIVE: Preg, Serum: NEGATIVE

## 2013-01-22 LAB — CBC
Hemoglobin: 12.9 g/dL (ref 12.0–15.0)
MCH: 25.5 pg — ABNORMAL LOW (ref 26.0–34.0)
MCHC: 32.3 g/dL (ref 30.0–36.0)
Platelets: 296 10*3/uL (ref 150–400)
RBC: 5.05 MIL/uL (ref 3.87–5.11)

## 2013-01-22 SURGERY — LAPAROSCOPY OPERATIVE
Anesthesia: General | Site: Vagina | Wound class: Clean Contaminated

## 2013-01-22 MED ORDER — GLYCOPYRROLATE 0.2 MG/ML IJ SOLN
INTRAMUSCULAR | Status: AC
  Filled 2013-01-22: qty 1

## 2013-01-22 MED ORDER — METHYLENE BLUE 1 % INJ SOLN
INTRAMUSCULAR | Status: DC | PRN
  Administered 2013-01-22: 1 mL

## 2013-01-22 MED ORDER — NEOSTIGMINE METHYLSULFATE 1 MG/ML IJ SOLN
INTRAMUSCULAR | Status: AC
  Filled 2013-01-22: qty 1

## 2013-01-22 MED ORDER — MIDAZOLAM HCL 5 MG/5ML IJ SOLN
INTRAMUSCULAR | Status: DC | PRN
  Administered 2013-01-22: 2 mg via INTRAVENOUS

## 2013-01-22 MED ORDER — LIDOCAINE HCL (CARDIAC) 20 MG/ML IV SOLN
INTRAVENOUS | Status: AC
  Filled 2013-01-22: qty 5

## 2013-01-22 MED ORDER — GLYCOPYRROLATE 0.2 MG/ML IJ SOLN
INTRAMUSCULAR | Status: DC | PRN
  Administered 2013-01-22: 0.6 mg via INTRAVENOUS
  Administered 2013-01-22: 0.2 mg via INTRAVENOUS

## 2013-01-22 MED ORDER — KETOROLAC TROMETHAMINE 30 MG/ML IJ SOLN
INTRAMUSCULAR | Status: AC
  Filled 2013-01-22: qty 1

## 2013-01-22 MED ORDER — METHYLENE BLUE 1 % INJ SOLN
INTRAMUSCULAR | Status: AC
  Filled 2013-01-22: qty 1

## 2013-01-22 MED ORDER — PROPOFOL 10 MG/ML IV BOLUS
INTRAVENOUS | Status: DC | PRN
  Administered 2013-01-22: 150 mg via INTRAVENOUS

## 2013-01-22 MED ORDER — EPHEDRINE 5 MG/ML INJ
INTRAVENOUS | Status: AC
  Filled 2013-01-22: qty 10

## 2013-01-22 MED ORDER — PROPOFOL 10 MG/ML IV EMUL
INTRAVENOUS | Status: AC
  Filled 2013-01-22: qty 20

## 2013-01-22 MED ORDER — MIDAZOLAM HCL 2 MG/2ML IJ SOLN
INTRAMUSCULAR | Status: AC
  Filled 2013-01-22: qty 2

## 2013-01-22 MED ORDER — DEXAMETHASONE SODIUM PHOSPHATE 10 MG/ML IJ SOLN
INTRAMUSCULAR | Status: DC | PRN
  Administered 2013-01-22: 4 mg via INTRAVENOUS

## 2013-01-22 MED ORDER — BUPIVACAINE HCL (PF) 0.25 % IJ SOLN
INTRAMUSCULAR | Status: DC | PRN
  Administered 2013-01-22: 12 mL

## 2013-01-22 MED ORDER — FENTANYL CITRATE 0.05 MG/ML IJ SOLN
INTRAMUSCULAR | Status: DC | PRN
  Administered 2013-01-22 (×4): 50 ug via INTRAVENOUS

## 2013-01-22 MED ORDER — FENTANYL CITRATE 0.05 MG/ML IJ SOLN
INTRAMUSCULAR | Status: AC
  Administered 2013-01-22: 50 ug via INTRAVENOUS
  Filled 2013-01-22: qty 2

## 2013-01-22 MED ORDER — LACTATED RINGERS IV SOLN
INTRAVENOUS | Status: DC
  Administered 2013-01-22 (×3): via INTRAVENOUS

## 2013-01-22 MED ORDER — ONDANSETRON HCL 4 MG/2ML IJ SOLN
INTRAMUSCULAR | Status: DC | PRN
  Administered 2013-01-22: 4 mg via INTRAVENOUS

## 2013-01-22 MED ORDER — ROCURONIUM BROMIDE 50 MG/5ML IV SOLN
INTRAVENOUS | Status: AC
  Filled 2013-01-22: qty 1

## 2013-01-22 MED ORDER — FENTANYL CITRATE 0.05 MG/ML IJ SOLN
INTRAMUSCULAR | Status: AC
  Filled 2013-01-22: qty 5

## 2013-01-22 MED ORDER — ROCURONIUM BROMIDE 100 MG/10ML IV SOLN
INTRAVENOUS | Status: DC | PRN
  Administered 2013-01-22: 40 mg via INTRAVENOUS

## 2013-01-22 MED ORDER — OXYCODONE-ACETAMINOPHEN 5-325 MG PO TABS: 1.0000 | ORAL_TABLET | Freq: Four times a day (QID) | ORAL | Status: DC | PRN

## 2013-01-22 MED ORDER — KETOROLAC TROMETHAMINE 30 MG/ML IJ SOLN
INTRAMUSCULAR | Status: DC | PRN
  Administered 2013-01-22: 30 mg via INTRAVENOUS

## 2013-01-22 MED ORDER — GLYCOPYRROLATE 0.2 MG/ML IJ SOLN
INTRAMUSCULAR | Status: AC
  Filled 2013-01-22: qty 2

## 2013-01-22 MED ORDER — DEXAMETHASONE SODIUM PHOSPHATE 10 MG/ML IJ SOLN
INTRAMUSCULAR | Status: AC
  Filled 2013-01-22: qty 1

## 2013-01-22 MED ORDER — LIDOCAINE HCL (CARDIAC) 20 MG/ML IV SOLN
INTRAVENOUS | Status: DC | PRN
  Administered 2013-01-22: 80 mg via INTRAVENOUS

## 2013-01-22 MED ORDER — ONDANSETRON HCL 4 MG/2ML IJ SOLN
INTRAMUSCULAR | Status: AC
  Filled 2013-01-22: qty 2

## 2013-01-22 MED ORDER — EPHEDRINE SULFATE 50 MG/ML IJ SOLN
INTRAMUSCULAR | Status: DC | PRN
  Administered 2013-01-22: 10 mg via INTRAVENOUS

## 2013-01-22 MED ORDER — NEOSTIGMINE METHYLSULFATE 1 MG/ML IJ SOLN
INTRAMUSCULAR | Status: DC | PRN
  Administered 2013-01-22: 3 mg via INTRAVENOUS

## 2013-01-22 MED ORDER — IBUPROFEN 600 MG PO TABS: 600.0000 mg | ORAL_TABLET | Freq: Four times a day (QID) | ORAL | Status: DC | PRN

## 2013-01-22 MED ORDER — FENTANYL CITRATE 0.05 MG/ML IJ SOLN
25.0000 ug | INTRAMUSCULAR | Status: DC | PRN
  Administered 2013-01-22: 50 ug via INTRAVENOUS

## 2013-01-22 SURGICAL SUPPLY — 33 items
ADH SKN CLS APL DERMABOND .7 (GAUZE/BANDAGES/DRESSINGS) ×2
BAG SPEC RTRVL LRG 6X4 10 (ENDOMECHANICALS)
CABLE HIGH FREQUENCY MONO STRZ (ELECTRODE) IMPLANT
CHLORAPREP W/TINT 26ML (MISCELLANEOUS) ×3 IMPLANT
CLOTH BEACON ORANGE TIMEOUT ST (SAFETY) ×3 IMPLANT
DERMABOND ADVANCED (GAUZE/BANDAGES/DRESSINGS) ×1
DERMABOND ADVANCED .7 DNX12 (GAUZE/BANDAGES/DRESSINGS) ×2 IMPLANT
FORCEPS CUTTING 33CM 5MM (CUTTING FORCEPS) IMPLANT
FORCEPS CUTTING 45CM 5MM (CUTTING FORCEPS) IMPLANT
GLOVE BIO SURGEON STRL SZ7.5 (GLOVE) ×4 IMPLANT
GLOVE BIOGEL PI IND STRL 7.5 (GLOVE) ×2 IMPLANT
GLOVE BIOGEL PI INDICATOR 7.5 (GLOVE) ×1
GOWN STRL REIN XL XLG (GOWN DISPOSABLE) ×3 IMPLANT
NEEDLE INSUFFLATION 120MM (ENDOMECHANICALS) IMPLANT
NS IRRIG 1000ML POUR BTL (IV SOLUTION) ×3 IMPLANT
PACK LAPAROSCOPY BASIN (CUSTOM PROCEDURE TRAY) ×3 IMPLANT
POUCH SPECIMEN RETRIEVAL 10MM (ENDOMECHANICALS) IMPLANT
PROTECTOR NERVE ULNAR (MISCELLANEOUS) ×4 IMPLANT
SET IRRIG TUBING LAPAROSCOPIC (IRRIGATION / IRRIGATOR) IMPLANT
SOLUTION ELECTROLUBE (MISCELLANEOUS) IMPLANT
SUT MON AB 4-0 PS1 27 (SUTURE) ×3 IMPLANT
SUT VICRYL 0 ENDOLOOP (SUTURE) IMPLANT
SUT VICRYL 0 TIES 12 18 (SUTURE) IMPLANT
SUT VICRYL 0 UR6 27IN ABS (SUTURE) ×4 IMPLANT
SYR 50ML LL SCALE MARK (SYRINGE) ×1 IMPLANT
TOWEL OR 17X24 6PK STRL BLUE (TOWEL DISPOSABLE) ×6 IMPLANT
TRAY FOLEY CATH 14FR (SET/KITS/TRAYS/PACK) ×3 IMPLANT
TROCAR BALLN 12MMX100 BLUNT (TROCAR) ×1 IMPLANT
TROCAR XCEL NON-BLD 11X100MML (ENDOMECHANICALS) ×3 IMPLANT
TROCAR XCEL NON-BLD 5MMX100MML (ENDOMECHANICALS) ×3 IMPLANT
TROCAR XCEL OPT SLVE 5M 100M (ENDOMECHANICALS) IMPLANT
WARMER LAPAROSCOPE (MISCELLANEOUS) ×3 IMPLANT
WATER STERILE IRR 1000ML POUR (IV SOLUTION) ×2 IMPLANT

## 2013-01-22 NOTE — Op Note (Signed)
Preop Diagnosis: Abdominal Pain   Postop Diagnosis: Abdominal Pain   Procedure: LAPAROSCOPY DIAGNOSTIC (OPEN) CHROMOPERTUBATION   Anesthesia: General   Anesthesiologist: Cristela Blue, MD  Attending: Purcell Nails, MD   Assistant:  N/a  Findings: Endometriotic implant overlying the left uterosacral ligament and ureter (bx was not done secondary to location but it looked typical of endometriosis).  Bilateral ovaries and tubes appeared to be within normal limits.  Tubes were blocked (by previous tubal ligation) bilaterally.  Pathology: N/a  Fluids: See Flowsheet  UOP: See Flowsheet  EBL: Minimal  Complications: None  Procedure: The patient was taken to the operating room after the risks, benefits, alternatives, complications, treatment options, and expected outcomes were discussed with the patient. The patient verbalized understanding, the patient concurred with the proposed plan and consent signed and witnessed. The patient was taken to the Operating Room, identified as [name] and the procedure verified as diagnostic laparoscopy.  A Time Out was held and the above information confirmed.  The patient was placed under general anesthesia per anesthesia staff, the patient was placed in modified dorsal lithotomy position and was prepped, draped, and catheterized in the normal, sterile fashion.  The cervix was visualized and an intrauterine manipulator (Acorn tenaculum) was placed.  A 10 mm umbilical incision was then performed and carried down to the underlying fascia and peritoneum.  A purse string of 0 vicryl was placed in the fascia.  Open laparoscopy performed and Hassan placed.  A 10 mm trocar was advanced into the intraabdominal cavity, the operative laparoscope was introduced and findings as noted above.  Patient was placed in trendelenburg and marcaine injected in the suprapubic area and a 5 mm incision was made and 5 mm trocar advanced into the intraabdominal  cavity under direct visualization.  Chromopertubation was performed and no bilateral tubal patency noted.  Endometriotic implant was noted in findings.  Uterus and ovaries appeared to be within normal limits as well as the appendix.    5mm Trochar was removed under direct visualization.  Pneumoperitoneum was relieved.  10 mm trocar removed under direct visualization.  0 vicryl suture on fascia at umbilicus was tied.  A subcuticular stitch of 3-0 monocryl was used to reapproximate the skin.  Dermabond was placed on both incisions.  Sponge, lap and needle count was correct.  Patient tolerated procedure well and was returned to the recovery room in good condition.

## 2013-01-22 NOTE — H&P (View-Only) (Signed)
  [name] is a 42 y.o. female, P 3-0-1-3 presenting for diagnostic laparoscopy because of pelvic pain.  Over the past year the patient will experience random sharp shooting pelvic pain several times a month.  These episodes will last from 10 minutes to 12 hours and is not made worse by any known activity but states that walking seems to help some.  She has chosen not to take analgesia for the pain.  Her menses is monthly lasting for 5 days with a pad change 3 times a day and no cramping. She denies urinary or bowel symptoms but occasionally will have dyspareunia and lower back pain.  A pelvic ultrasound done early July showed, uterus-7.4 x 6.7 x 5.47 cm with right ovary-2.90 x 1.87 x 1.85 cm and left ovary-3.52 x 2.17 x 1.96.  No cul-de-sac fluid or adnexal abnormalities seen. Tests for gonorrhea and chlamydia were negative.  Given the protracted nature of her symptoms, the patient has decided to proceed with diagnostic laparoscopy for further evaluation.   Past Medical History  OB History: G4 P 3-0-1-3 SVD 1993, 2004 and 2006  GYN History: menarche: 42 YO;    LMP: 11/29/2012;    Contracepton bilateral tubal ligation  The patient denies history of sexually transmitted disease.  Denies history of abnormal PAP smear  Last PAP smear  Medical History: Anemia, menorrhagia, vitamin D deficiency  Surgical History: 2006 Tubal Sterilization;   2009 Cholecystectomy Denies problems with anesthesia or history of blood transfusions  Family History: Heart disease, asthma, hypertension, diabetes mellitus, migraines, stroke  Social History:  Single and unemployed; Denies alcohol, tobacco or illicit drug use  Medications: Multivitamins daily  No Known Allergies  Denies sensitivity to peanuts, shellfish, soy, latex or adhesives.    ROS: Admits to glasses but  denies headache, vision changes, nasal congestion, dysphagia, tinnitus, dizziness, hoarseness, cough,  chest pain, shortness of breath, nausea,  vomiting, diarrhea,constipation,  urinary frequency, urgency  dysuria, hematuria, vaginitis symptoms, swelling of joints,easy bruising,  myalgias, arthralgias, skin rashes, unexplained weight loss and except as is mentioned in the history of present illness, patient's review of systems is otherwise negative.  Physical Exam  Bp-118/72   P-60  R-12   Temp.-98.5 degrees F orally   Weight-214 lbs  Height-5'3"  BMI-37.9  Neck: supple without masses or thyromegaly Lungs: clear to auscultation Heart: regular rate and rhythm Abdomen: soft, non-tender and no organomegaly Pelvic:EGBUS- wnl; vagina-normal rugae; uterus-normal size, cervix without lesions or motion tenderness; adnexae-no tenderness or masses Extremities:  no clubbing, cyanosis or edema   Assesment:  Pelvic Pain   Disposition:  A discussion was held with patient regarding the indication for her procedure(s) along with the risks, which include but are not limited to: reaction to anesthesia, damage to adjacent organs, infection and excessive bleeding. The patient verbalized understanding of these risks and has consented to proceed with Diagnostic Laparoscopy with Chromopertubation at [facility] on  January 22, 2013 at 2 p. m.   CSN# [id number]   Quanda Pavlicek J. Lowell Guitar, PA-C  for Dr. Woodroe Mode.Su Hilt

## 2013-01-22 NOTE — Anesthesia Postprocedure Evaluation (Signed)
  Anesthesia Post-op Note  Patient: [name]  Procedure(s) Performed: Procedure(s): LAPAROSCOPY OPERATIVE (N/A) CHROMOPERTUBATION (Bilateral) Patient is awake and responsive. Pain and nausea are reasonably well controlled. Vital signs are stable and clinically acceptable. Oxygen saturation is clinically acceptable. There are no apparent anesthetic complications at this time. Patient is ready for discharge.

## 2013-01-22 NOTE — Anesthesia Preprocedure Evaluation (Addendum)
Anesthesia Evaluation  Patient identified by MRN, date of birth, ID band Patient awake    Reviewed: Allergy & Precautions, H&P , Patient's Chart, lab work & pertinent test results, reviewed documented beta blocker date and time   Airway Mallampati: II TM Distance: >3 FB Neck ROM: full    Dental no notable dental hx.    Pulmonary  breath sounds clear to auscultation  Pulmonary exam normal       Cardiovascular Rhythm:regular Rate:Normal     Neuro/Psych    GI/Hepatic   Endo/Other  Morbid obesity  Renal/GU      Musculoskeletal   Abdominal   Peds  Hematology   Anesthesia Other Findings   Reproductive/Obstetrics                           Anesthesia Physical Anesthesia Plan  ASA: III  Anesthesia Plan: General   Post-op Pain Management:    Induction: Intravenous  Airway Management Planned: Oral ETT  Additional Equipment:   Intra-op Plan:   Post-operative Plan: Extubation in OR  Informed Consent: I have reviewed the patients History and Physical, chart, labs and discussed the procedure including the risks, benefits and alternatives for the proposed anesthesia with the patient or authorized representative who has indicated his/her understanding and acceptance.   Dental Advisory Given and Dental advisory given  Plan Discussed with: CRNA and Surgeon  Anesthesia Plan Comments: (  Discussed general anesthesia, including possible nausea, instrumentation of airway, sore throat,pulmonary aspiration, etc. I asked if the were any outstanding questions, or  concerns before we proceeded. )        Anesthesia Quick Evaluation  

## 2013-01-22 NOTE — Interval H&P Note (Signed)
History and Physical Interval Note:  01/22/2013 2:14 PM  [name]  has presented today for surgery, with the diagnosis of Abdominal Pain; CPT 49320, [phone]   The various methods of treatment have been discussed with the patient and family. After consideration of risks, benefits and other options for treatment, the patient has consented to  Procedure(s): DIAGNOSTIC LAPAROSCOPY (N/A)  POSSIBLY OPERATIVE LAPAROSCOPY CHROMOPERTUBATION (Bilateral) as a surgical intervention .  The patient's history has been reviewed, patient examined, no change in status, stable for surgery.  I have reviewed the patient's chart and labs.  Questions were answered to the patient's satisfaction.     Purcell Nails

## 2013-01-22 NOTE — Preoperative (Addendum)
Beta Blockers   Reason not to administer Beta Blockers:Not Applicable 

## 2013-01-22 NOTE — Transfer of Care (Signed)
Immediate Anesthesia Transfer of Care Note  Patient: [name]  Procedure(s) Performed: Procedure(s): LAPAROSCOPY OPERATIVE (N/A) CHROMOPERTUBATION (Bilateral)  Patient Location: PACU  Anesthesia Type:General  Level of Consciousness: awake, oriented and patient cooperative  Airway & Oxygen Therapy: Patient Spontanous Breathing and Patient connected to nasal cannula oxygen  Post-op Assessment: Report given to PACU RN and Post -op Vital signs reviewed and stable  Post vital signs: Reviewed and stable  Complications: No apparent anesthesia complications

## 2013-02-09 ENCOUNTER — Ambulatory Visit
Admission: RE | Admit: 2013-02-09 | Discharge: 2013-02-09 | Disposition: A | Discharge status: Home / Self Care | Payer: Medicaid Other | Source: Ambulatory Visit | Attending: Obstetrics and Gynecology | Admitting: Obstetrics and Gynecology

## 2013-02-09 DIAGNOSIS — R928 Other abnormal and inconclusive findings on diagnostic imaging of breast: Secondary | ICD-10-CM

## 2013-06-20 ENCOUNTER — Encounter (HOSPITAL_COMMUNITY): Payer: Self-pay | Admitting: Emergency Medicine

## 2013-06-20 ENCOUNTER — Emergency Department (HOSPITAL_COMMUNITY)
Admission: EM | Admit: 2013-06-20 | Discharge: 2013-06-20 | Disposition: A | Discharge status: Home / Self Care | Payer: Medicaid Other | Source: Home / Self Care

## 2013-06-20 DIAGNOSIS — N390 Urinary tract infection, site not specified: Secondary | ICD-10-CM

## 2013-06-20 LAB — POCT URINALYSIS DIP (DEVICE)
BILIRUBIN URINE: NEGATIVE
GLUCOSE, UA: 100 mg/dL — AB
Ketones, ur: NEGATIVE mg/dL
NITRITE: POSITIVE — AB
Protein, ur: NEGATIVE mg/dL
SPECIFIC GRAVITY, URINE: 1.02 (ref 1.005–1.030)
UROBILINOGEN UA: 1 mg/dL (ref 0.0–1.0)
pH: 5 (ref 5.0–8.0)

## 2013-06-20 LAB — POCT PREGNANCY, URINE: PREG TEST UR: NEGATIVE

## 2013-06-20 MED ORDER — CEPHALEXIN 250 MG PO CAPS: 250.0000 mg | ORAL_CAPSULE | Freq: Four times a day (QID) | ORAL | Status: DC

## 2013-06-20 NOTE — ED Notes (Signed)
Onset yesterday urinary frequency and constant pelvic pressure with no fever.

## 2013-06-20 NOTE — ED Provider Notes (Signed)
Medical screening examination/treatment/procedure(s) were performed by resident physician or non-physician practitioner and as supervising physician I was immediately available for consultation/collaboration.   Pauline Good MD.   Billy Fischer, MD 06/20/13 2026

## 2013-06-20 NOTE — Discharge Instructions (Signed)
Urinary Tract Infection  Urinary tract infections (UTIs) can develop anywhere along your urinary tract. Your urinary tract is your body's drainage system for removing wastes and extra water. Your urinary tract includes two kidneys, two ureters, a bladder, and a urethra. Your kidneys are a pair of bean-shaped organs. Each kidney is about the size of your fist. They are located below your ribs, one on each side of your spine.  CAUSES  Infections are caused by microbes, which are microscopic organisms, including fungi, viruses, and bacteria. These organisms are so small that they can only be seen through a microscope. Bacteria are the microbes that most commonly cause UTIs.  SYMPTOMS   Symptoms of UTIs may vary by age and gender of the patient and by the location of the infection. Symptoms in young women typically include a frequent and intense urge to urinate and a painful, burning feeling in the bladder or urethra during urination. Older women and men are more likely to be tired, shaky, and weak and have muscle aches and abdominal pain. A fever may mean the infection is in your kidneys. Other symptoms of a kidney infection include pain in your back or sides below the ribs, nausea, and vomiting.  DIAGNOSIS  To diagnose a UTI, your caregiver will ask you about your symptoms. Your caregiver also will ask to provide a urine sample. The urine sample will be tested for bacteria and white blood cells. White blood cells are made by your body to help fight infection.  TREATMENT   Typically, UTIs can be treated with medication. Because most UTIs are caused by a bacterial infection, they usually can be treated with the use of antibiotics. The choice of antibiotic and length of treatment depend on your symptoms and the type of bacteria causing your infection.  HOME CARE INSTRUCTIONS   If you were prescribed antibiotics, take them exactly as your caregiver instructs you. Finish the medication even if you feel better after you  have only taken some of the medication.   Drink enough water and fluids to keep your urine clear or pale yellow.   Avoid caffeine, tea, and carbonated beverages. They tend to irritate your bladder.   Empty your bladder often. Avoid holding urine for long periods of time.   Empty your bladder before and after sexual intercourse.   After a bowel movement, women should cleanse from front to back. Use each tissue only once.  SEEK MEDICAL CARE IF:    You have back pain.   You develop a fever.   Your symptoms do not begin to resolve within 3 days.  SEEK IMMEDIATE MEDICAL CARE IF:    You have severe back pain or lower abdominal pain.   You develop chills.   You have nausea or vomiting.   You have continued burning or discomfort with urination.  MAKE SURE YOU:    Understand these instructions.   Will watch your condition.   Will get help right away if you are not doing well or get worse.  Document Released: 03/14/2005 Document Revised: 12/04/2011 Document Reviewed: 07/13/2011  [employer] Patient Information 2014 [employer].

## 2013-06-20 NOTE — ED Provider Notes (Signed)
CSN: [id number]     Arrival date & time 06/20/13  0160 History   First MD Initiated Contact with Patient 06/20/13 1105     Chief Complaint  Patient presents with  . Urinary Tract Infection   (Consider location/radiation/quality/duration/timing/severity/associated sxs/prior Treatment) HPI Comments: Patient is here for what she believes to be urinary tract infection. She complains of low mid pelvic pain some vulvovaginal itching low back pain and urinary frequency. Denies vaginal discharge.   Past Medical History  Diagnosis Date  . History of chicken pox   . History of rubella   . Anemia   . Menorrhagia   . Increased abdominal girth    Past Surgical History  Procedure Laterality Date  . Cholecystectomy    . Tubal ligation    . Cervial cyst    . Laparoscopy N/A 01/22/2013    Procedure: LAPAROSCOPY OPERATIVE;  Surgeon: Delice Lesch, MD;  Location: [facility];  Service: Gynecology;  Laterality: N/A;  . Chromopertubation Bilateral 01/22/2013    Procedure: CHROMOPERTUBATION;  Surgeon: Delice Lesch, MD;  Location: [facility];  Service: Gynecology;  Laterality: Bilateral;   No family history on file. History  Substance Use Topics  . Smoking status: Never Smoker   . Smokeless tobacco: Never Used  . Alcohol Use: No   OB History   Grav Para Term Preterm Abortions TAB SAB Ect Mult Living   4 3   1           Review of Systems  Constitutional: Negative.   Respiratory: Negative.   Gastrointestinal: Negative.   Genitourinary: Positive for frequency and pelvic pain. Negative for dysuria, flank pain and vaginal discharge.  Skin: Negative for rash.    Allergies  Review of patient's allergies indicates no known allergies.  Home Medications   Current Outpatient Rx  Name  Route  Sig  Dispense  Refill  . ibuprofen (ADVIL,MOTRIN) 600 MG tablet   Oral   Take 1 tablet (600 mg total) by mouth every 6 (six) hours as needed for pain.   30 tablet   0   . Multiple Vitamin (MULITIVITAMIN WITH  MINERALS) TABS   Oral   Take 1 tablet by mouth daily.           . cephALEXin (KEFLEX) 250 MG capsule   Oral   Take 1 capsule (250 mg total) by mouth 4 (four) times daily.   28 capsule   0   . oxyCODONE-acetaminophen (PERCOCET) 5-325 MG per tablet   Oral   Take 1-2 tablets by mouth every 6 (six) hours as needed for pain.   30 tablet   0    BP 116/71  Pulse 64  Temp(Src) 98.3 F (36.8 C) (Oral)  Resp 16  SpO2 97%  LMP 06/15/2013 Physical Exam  Nursing note and vitals reviewed. Constitutional: She is oriented to person, place, and time. She appears well-developed and well-nourished. No distress.  Neck: Normal range of motion. Neck supple.  Pulmonary/Chest: Effort normal. No respiratory distress.  Musculoskeletal: She exhibits no edema.  Neurological: She is alert and oriented to person, place, and time. She exhibits normal muscle tone.  Skin: Skin is warm and dry.  Psychiatric: She has a normal mood and affect.    ED Course  Procedures (including critical care time) Labs Review Labs Reviewed  POCT URINALYSIS DIP (DEVICE) - Abnormal; Notable for the following:    Glucose, UA 100 (*)    Hgb urine dipstick MODERATE (*)    Nitrite POSITIVE (*)  Leukocytes, UA TRACE (*)    All other components within normal limits  POCT PREGNANCY, URINE   Imaging Review No results found. Results for orders placed during the hospital encounter of 06/20/13  POCT URINALYSIS DIP (DEVICE)      Result Value Range   Glucose, UA 100 (*) NEGATIVE mg/dL   Bilirubin Urine NEGATIVE  NEGATIVE   Ketones, ur NEGATIVE  NEGATIVE mg/dL   Specific Gravity, Urine 1.020  1.005 - 1.030   Hgb urine dipstick MODERATE (*) NEGATIVE   pH 5.0  5.0 - 8.0   Protein, ur NEGATIVE  NEGATIVE mg/dL   Urobilinogen, UA 1.0  0.0 - 1.0 mg/dL   Nitrite POSITIVE (*) NEGATIVE   Leukocytes, UA TRACE (*) NEGATIVE  POCT PREGNANCY, URINE      Result Value Range   Preg Test, Ur NEGATIVE  NEGATIVE      MDM   1.  UTI (lower urinary tract infection)    Keflex 250 mg 4 times a day for 7 days. Drink plenty of fluids stay well hydrated and AZO ir pyridium  for symptoms for 2 days.    Janne Napoleon, NP 06/20/13 1137

## 2014-04-19 ENCOUNTER — Encounter (HOSPITAL_COMMUNITY): Payer: Self-pay | Admitting: Emergency Medicine

## 2014-07-29 ENCOUNTER — Emergency Department (HOSPITAL_COMMUNITY)
Admission: EM | Admit: 2014-07-29 | Discharge: 2014-07-29 | Disposition: A | Discharge status: Home / Self Care | Payer: Medicaid Other | Attending: Emergency Medicine | Admitting: Emergency Medicine

## 2014-07-29 ENCOUNTER — Encounter (HOSPITAL_COMMUNITY): Payer: Self-pay | Admitting: Emergency Medicine

## 2014-07-29 DIAGNOSIS — R140 Abdominal distension (gaseous): Secondary | ICD-10-CM | POA: Diagnosis not present

## 2014-07-29 DIAGNOSIS — R531 Weakness: Secondary | ICD-10-CM | POA: Insufficient documentation

## 2014-07-29 DIAGNOSIS — Z793 Long term (current) use of hormonal contraceptives: Secondary | ICD-10-CM | POA: Diagnosis not present

## 2014-07-29 DIAGNOSIS — R42 Dizziness and giddiness: Secondary | ICD-10-CM | POA: Insufficient documentation

## 2014-07-29 DIAGNOSIS — R5383 Other fatigue: Secondary | ICD-10-CM | POA: Insufficient documentation

## 2014-07-29 DIAGNOSIS — Z79899 Other long term (current) drug therapy: Secondary | ICD-10-CM | POA: Insufficient documentation

## 2014-07-29 DIAGNOSIS — Z8742 Personal history of other diseases of the female genital tract: Secondary | ICD-10-CM | POA: Diagnosis not present

## 2014-07-29 DIAGNOSIS — Z3202 Encounter for pregnancy test, result negative: Secondary | ICD-10-CM | POA: Diagnosis not present

## 2014-07-29 DIAGNOSIS — D649 Anemia, unspecified: Secondary | ICD-10-CM | POA: Insufficient documentation

## 2014-07-29 DIAGNOSIS — Z8619 Personal history of other infectious and parasitic diseases: Secondary | ICD-10-CM | POA: Diagnosis not present

## 2014-07-29 DIAGNOSIS — H539 Unspecified visual disturbance: Secondary | ICD-10-CM | POA: Insufficient documentation

## 2014-07-29 DIAGNOSIS — R51 Headache: Secondary | ICD-10-CM | POA: Diagnosis not present

## 2014-07-29 LAB — COMPREHENSIVE METABOLIC PANEL
ALBUMIN: 3.6 g/dL (ref 3.5–5.2)
ALK PHOS: 60 U/L (ref 39–117)
ALT: 19 U/L (ref 0–35)
AST: 18 U/L (ref 0–37)
Anion gap: 8 (ref 5–15)
BUN: <5 mg/dL — ABNORMAL LOW (ref 6–23)
CHLORIDE: 105 mmol/L (ref 96–112)
CO2: 24 mmol/L (ref 19–32)
Calcium: 9.1 mg/dL (ref 8.4–10.5)
Creatinine, Ser: 0.69 mg/dL (ref 0.50–1.10)
GFR calc Af Amer: >90 mL/min (ref >90)
GLUCOSE: 106 mg/dL — AB (ref 70–99)
POTASSIUM: 4.2 mmol/L (ref 3.5–5.1)
SODIUM: 137 mmol/L (ref 135–145)
TOTAL PROTEIN: 7.3 g/dL (ref 6.0–8.3)
Total Bilirubin: 0.7 mg/dL (ref 0.3–1.2)

## 2014-07-29 LAB — CBC WITH DIFFERENTIAL/PLATELET
BASOS ABS: 0 10*3/uL (ref 0.0–0.1)
BASOS PCT: 0 % (ref 0–1)
EOS ABS: 0.1 10*3/uL (ref 0.0–0.7)
EOS PCT: 1 % (ref 0–5)
HCT: 38.2 % (ref 36.0–46.0)
HEMOGLOBIN: 12.2 g/dL (ref 12.0–15.0)
Lymphocytes Relative: 26 % (ref 12–46)
Lymphs Abs: 2 10*3/uL (ref 0.7–4.0)
MCH: 25.7 pg — AB (ref 26.0–34.0)
MCHC: 31.9 g/dL (ref 30.0–36.0)
MCV: 80.4 fL (ref 78.0–100.0)
MONO ABS: 0.4 10*3/uL (ref 0.1–1.0)
Monocytes Relative: 5 % (ref 3–12)
Neutro Abs: 5.2 10*3/uL (ref 1.7–7.7)
Neutrophils Relative %: 68 % (ref 43–77)
PLATELETS: 301 10*3/uL (ref 150–400)
RBC: 4.75 MIL/uL (ref 3.87–5.11)
RDW: 15.6 % — ABNORMAL HIGH (ref 11.5–15.5)
WBC: 7.7 10*3/uL (ref 4.0–10.5)

## 2014-07-29 LAB — URINALYSIS, ROUTINE W REFLEX MICROSCOPIC
Bilirubin Urine: NEGATIVE
GLUCOSE, UA: NEGATIVE mg/dL
Hgb urine dipstick: NEGATIVE
KETONES UR: 40 mg/dL — AB
LEUKOCYTES UA: NEGATIVE
NITRITE: NEGATIVE
PROTEIN: NEGATIVE mg/dL
Specific Gravity, Urine: 1.014 (ref 1.005–1.030)
UROBILINOGEN UA: 0.2 mg/dL (ref 0.0–1.0)
pH: 6.5 (ref 5.0–8.0)

## 2014-07-29 LAB — PROTIME-INR
INR: 0.95 (ref 0.00–1.49)
Prothrombin Time: 12.8 seconds (ref 11.6–15.2)

## 2014-07-29 LAB — TSH: TSH: 1.936 u[IU]/mL (ref 0.350–4.500)

## 2014-07-29 LAB — LIPASE, BLOOD: Lipase: 23 U/L (ref 11–59)

## 2014-07-29 LAB — PREGNANCY, URINE: PREG TEST UR: NEGATIVE

## 2014-07-29 NOTE — ED Provider Notes (Signed)
CSN: [id number]     Arrival date & time 07/29/14  1010 History   First MD Initiated Contact with Patient 07/29/14 1020     Chief Complaint  Patient presents with  . Dizziness  . Blurred Vision  . Abdominal Pain     (Consider location/radiation/quality/duration/timing/severity/associated sxs/prior Treatment) HPI The patient states that for a month she has been developing gradual onsets of symptoms of increasing fatigue and generalized weakness. She reports even now with minor activity that she gets very fatigued and has to rest. She reports she is also getting short of breath with exertion. The patient reports that she is also had headaches that have been coming and going for the past month. They may be present 1 day and then go on another day. They don't have a specific pattern. She reports they may occur anywhere on her head. She has not had focal weakness or extremity dysfunction. She reports this morning she felt dizzy upon awakening at 6 AM. The patient doesn't qualify a vertiginous versus lightheadedness quality. She can really only describe it as dizzy. She reports her vision has been getting slightly blurry this is been developing over time. It comes and goes. No double vision or loss of vision. The patient also notes that she's had abdominal bloating for the past month or so. She denies abnormal bowel movements. She reports she is nauseated but has not had vomiting. She reports she does have very heavy menstrual cycles. She reports her last mental cycle ended approximately 5 days ago. It had lasted about 5 days. She reports she only time that she knows she was anemic was during her pregnancy. She reports she's been trying to take some extra vitamin pills for fatigue and weakness. She reports it seemed to help a little bit but not much. Past Medical History  Diagnosis Date  . History of chicken pox   . History of rubella   . Anemia   . Menorrhagia   . Increased abdominal girth    Past  Surgical History  Procedure Laterality Date  . Cholecystectomy    . Tubal ligation    . Cervial cyst    . Laparoscopy N/A 01/22/2013    Procedure: LAPAROSCOPY OPERATIVE;  Surgeon: Delice Lesch, MD;  Location: [facility];  Service: Gynecology;  Laterality: N/A;  . Chromopertubation Bilateral 01/22/2013    Procedure: CHROMOPERTUBATION;  Surgeon: Delice Lesch, MD;  Location: [facility];  Service: Gynecology;  Laterality: Bilateral;   No family history on file. History  Substance Use Topics  . Smoking status: Never Smoker   . Smokeless tobacco: Never Used  . Alcohol Use: No   OB History    Gravida Para Term Preterm AB TAB SAB Ectopic Multiple Living   4 3   1           Review of Systems 10 Systems reviewed and are negative for acute change except as noted in the HPI.    Allergies  Review of patient's allergies indicates no known allergies.  Home Medications   Prior to Admission medications   Medication Sig Start Date End Date Taking? Authorizing Provider  Multiple Vitamin (MULITIVITAMIN WITH MINERALS) TABS Take 1 tablet by mouth daily.     Yes Historical Provider, MD  cephALEXin (KEFLEX) 250 MG capsule Take 1 capsule (250 mg total) by mouth 4 (four) times daily. 06/20/13   Janne Napoleon, NP  ibuprofen (ADVIL,MOTRIN) 600 MG tablet Take 1 tablet (600 mg total) by mouth every 6 (six) hours  as needed for pain. 01/22/13   Delice Lesch, MD  oxyCODONE-acetaminophen (PERCOCET) 5-325 MG per tablet Take 1-2 tablets by mouth every 6 (six) hours as needed for pain. 01/22/13   Delice Lesch, MD   BP 114/77 mmHg  Pulse 80  Temp(Src) 99.1 F (37.3 C) (Oral)  Resp 15  Ht 5\' 3"  (1.6 m)  Wt 200 lb (90.719 kg)  BMI 35.44 kg/m2  SpO2 100%  LMP 07/20/2014 (Exact Date) Physical Exam  Constitutional: She is oriented to person, place, and time. She appears well-developed and well-nourished.  HENT:  Head: Normocephalic and atraumatic.  Eyes: EOM are normal. Pupils are equal, round, and reactive to  light.  Neck: Neck supple.  Cardiovascular: Normal rate, regular rhythm, normal heart sounds and intact distal pulses.   Pulmonary/Chest: Effort normal and breath sounds normal.  Abdominal: Soft. Bowel sounds are normal. She exhibits no distension. There is no tenderness.  Musculoskeletal: Normal range of motion. She exhibits no edema.  Neurological: She is alert and oriented to person, place, and time. She has normal strength. Coordination normal. GCS eye subscore is 4. GCS verbal subscore is 5. GCS motor subscore is 6.  Skin: Skin is warm, dry and intact.  Psychiatric: She has a normal mood and affect.    ED Course  Procedures (including critical care time) Labs Review Labs Reviewed  COMPREHENSIVE METABOLIC PANEL - Abnormal; Notable for the following:    Glucose, Bld 106 (*)    BUN <5 (*)    All other components within normal limits  CBC WITH DIFFERENTIAL/PLATELET - Abnormal; Notable for the following:    MCH 25.7 (*)    RDW 15.6 (*)    All other components within normal limits  URINALYSIS, ROUTINE W REFLEX MICROSCOPIC - Abnormal; Notable for the following:    Ketones, ur 40 (*)    All other components within normal limits  LIPASE, BLOOD  PROTIME-INR  TSH  T3, FREE  PREGNANCY, URINE  POC URINE PREG, ED    Imaging Review No results found.   EKG Interpretation   Date/Time:  Thursday July 29 2014 11:13:03 EST Ventricular Rate:  68 PR Interval:  161 QRS Duration: 72 QT Interval:  388 QTC Calculation: 413 R Axis:   35 Text Interpretation:  Sinus rhythm ED PHYSICIAN INTERPRETATION AVAILABLE  IN [facility] [place] Confirmed by TEST, Record ([id number]) on 07/31/2014 2:43:20  PM      MDM   Final diagnoses:  Other fatigue  Dizziness   The patient presents with generalized symptoms with gradual onset. At this point there is no diagnostic or vital sign abnormality. The patient will be advised for family physician follow-up for continued monitoring of symptoms and further  workup if indicated.    Charlesetta Shanks, MD 08/03/14 [phone]

## 2014-07-29 NOTE — Discharge Instructions (Signed)
Dizziness °Dizziness is a common problem. It is a feeling of unsteadiness or light-headedness. You may feel like you are about to faint. Dizziness can lead to injury if you stumble or fall. A person of any age group can suffer from dizziness, but dizziness is more common in older adults. °CAUSES  °Dizziness can be caused by many different things, including: °· Middle ear problems. °· Standing for too long. °· Infections. °· An allergic reaction. °· Aging. °· An emotional response to something, such as the sight of blood. °· Side effects of medicines. °· Tiredness. °· Problems with circulation or blood pressure. °· Excessive use of alcohol or medicines, or illegal drug use. °· Breathing too fast (hyperventilation). °· An irregular heart rhythm (arrhythmia). °· A low red blood cell count (anemia). °· Pregnancy. °· Vomiting, diarrhea, fever, or other illnesses that cause body fluid loss (dehydration). °· Diseases or conditions such as Parkinson's disease, high blood pressure (hypertension), diabetes, and thyroid problems. °· Exposure to extreme heat. °DIAGNOSIS  °Your health care provider will ask about your symptoms, perform a physical exam, and perform an electrocardiogram (ECG) to record the electrical activity of your heart. Your health care provider may also perform other heart or blood tests to determine the cause of your dizziness. These may include: °· Transthoracic echocardiogram (TTE). During echocardiography, sound waves are used to evaluate how blood flows through your heart. °· Transesophageal echocardiogram (TEE). °· Cardiac monitoring. This allows your health care provider to monitor your heart rate and rhythm in real time. °· Holter monitor. This is a portable device that records your heartbeat and can help diagnose heart arrhythmias. It allows your health care provider to track your heart activity for several days if needed. °· Stress tests by exercise or by giving medicine that makes the heart beat  faster. °TREATMENT  °Treatment of dizziness depends on the cause of your symptoms and can vary greatly. °HOME CARE INSTRUCTIONS  °· Drink enough fluids to keep your urine clear or pale yellow. This is especially important in very hot weather. In older adults, it is also important in cold weather. °· Take your medicine exactly as directed if your dizziness is caused by medicines. When taking blood pressure medicines, it is especially important to get up slowly. °· Rise slowly from chairs and steady yourself until you feel okay. °· In the morning, first sit up on the side of the bed. When you feel okay, stand slowly while holding onto something until you know your balance is fine. °· Move your legs often if you need to stand in one place for a long time. Tighten and relax your muscles in your legs while standing. °· Have someone stay with you for 1-2 days if dizziness continues to be a problem. Do this until you feel you are well enough to stay alone. Have the person call your health care provider if he or she notices changes in you that are concerning. °· Do not drive or use heavy machinery if you feel dizzy. °· Do not drink alcohol. °SEEK IMMEDIATE MEDICAL CARE IF:  °· Your dizziness or light-headedness gets worse. °· You feel nauseous or vomit. °· You have problems talking, walking, or using your arms, hands, or legs. °· You feel weak. °· You are not thinking clearly or you have trouble forming sentences. It may take a friend or family member to notice this. °· You have chest pain, abdominal pain, shortness of breath, or sweating. °· Your vision changes. °· You notice   any bleeding.  You have side effects from medicine that seems to be getting worse rather than better. MAKE SURE YOU:   Understand these instructions.  Will watch your condition.  Will get help right away if you are not doing well or get worse. Document Released: 11/28/2000 Document Revised: 06/09/2013 Document Reviewed: 12/22/2010 [facility]  Patient Information 2015 [place], [place]. This information is not intended to replace advice given to you by your health care provider. Make sure you discuss any questions you have with your health care provider. Fatigue Fatigue is a feeling of tiredness, lack of energy, lack of motivation, or feeling tired all the time. Having enough rest, good nutrition, and reducing stress will normally reduce fatigue. Consult your caregiver if it persists. The nature of your fatigue will help your caregiver to find out its cause. The treatment is based on the cause.  CAUSES  There are many causes for fatigue. Most of the time, fatigue can be traced to one or more of your habits or routines. Most causes fit into one or more of three general areas. They are: Lifestyle problems  Sleep disturbances.  Overwork.  Physical exertion.  Unhealthy habits.  Poor eating habits or eating disorders.  Alcohol and/or drug use .  Lack of proper nutrition (malnutrition). Psychological problems  Stress and/or anxiety problems.  Depression.  Grief.  Boredom. Medical Problems or Conditions  Anemia.  Pregnancy.  Thyroid gland problems.  Recovery from major surgery.  Continuous pain.  Emphysema or asthma that is not well controlled  Allergic conditions.  Diabetes.  Infections (such as mononucleosis).  Obesity.  Sleep disorders, such as sleep apnea.  Heart failure or other heart-related problems.  Cancer.  Kidney disease.  Liver disease.  Effects of certain medicines such as antihistamines, cough and cold remedies, prescription pain medicines, heart and blood pressure medicines, drugs used for treatment of cancer, and some antidepressants. SYMPTOMS  The symptoms of fatigue include:   Lack of energy.  Lack of drive (motivation).  Drowsiness.  Feeling of indifference to the surroundings. DIAGNOSIS  The details of how you feel help guide your caregiver in finding out what is causing  the fatigue. You will be asked about your present and past health condition. It is important to review all medicines that you take, including prescription and non-prescription items. A thorough exam will be done. You will be questioned about your feelings, habits, and normal lifestyle. Your caregiver may suggest blood tests, urine tests, or other tests to look for common medical causes of fatigue.  TREATMENT  Fatigue is treated by correcting the underlying cause. For example, if you have continuous pain or depression, treating these causes will improve how you feel. Similarly, adjusting the dose of certain medicines will help in reducing fatigue.  HOME CARE INSTRUCTIONS   Try to get the required amount of good sleep every night.  Eat a healthy and nutritious diet, and drink enough water throughout the day.  Practice ways of relaxing (including yoga or meditation).  Exercise regularly.  Make plans to change situations that cause stress. Act on those plans so that stresses decrease over time. Keep your work and personal routine reasonable.  Avoid street drugs and minimize use of alcohol.  Start taking a daily multivitamin after consulting your caregiver. SEEK MEDICAL CARE IF:   You have persistent tiredness, which cannot be accounted for.  You have fever.  You have unintentional weight loss.  You have headaches.  You have disturbed sleep throughout the night.  You are feeling sad.  You have constipation.  You have dry skin.  You have gained weight.  You are taking any new or different medicines that you suspect are causing fatigue.  You are unable to sleep at night.  You develop any unusual swelling of your legs or other parts of your body. SEEK IMMEDIATE MEDICAL CARE IF:   You are feeling confused.  Your vision is blurred.  You feel faint or pass out.  You develop severe headache.  You develop severe abdominal, pelvic, or back pain.  You develop chest pain,  shortness of breath, or an irregular or fast heartbeat.  You are unable to pass a normal amount of urine.  You develop abnormal bleeding such as bleeding from the rectum or you vomit blood.  You have thoughts about harming yourself or committing suicide.  You are worried that you might harm someone else. MAKE SURE YOU:   Understand these instructions.  Will watch your condition.  Will get help right away if you are not doing well or get worse. Document Released: 04/01/2007 Document Revised: 08/27/2011 Document Reviewed: 10/06/2013 [facility] Patient Information 2015 [place], [place]. This information is not intended to replace advice given to you by your health care provider. Make sure you discuss any questions you have with your health care provider.

## 2014-07-29 NOTE — ED Notes (Signed)
Patient states that she has had dizziness since she woke up this morning at 0600, also slightly blurred vision.  Has hx of headaches that she states she has every other day or so.  Also states that she feels bloated and only slightly nauseated.  Went to bed last night around 2200.  States that she feels sob when asked but has no visible increased work of breathing. VSS. Alert and oriented, ambulatory but unsteady gait and states she has difficulty walking.  States that she feels like she is spinning and not the room.

## 2014-07-29 NOTE — ED Notes (Signed)
Lab called to ensure that urine pregnancy was being processed as well as urinalysis, which has resulted.  Lab technician Freda Munro states "we'll get it."

## 2014-07-30 LAB — T3, FREE: T3, Free: 2.9 pg/mL (ref 2.0–4.4)

## 2014-10-22 ENCOUNTER — Emergency Department (HOSPITAL_COMMUNITY): Payer: Medicaid Other

## 2014-10-22 ENCOUNTER — Emergency Department (HOSPITAL_COMMUNITY)
Admission: EM | Admit: 2014-10-22 | Discharge: 2014-10-23 | Disposition: A | Discharge status: Home / Self Care | Payer: Medicaid Other | Attending: Emergency Medicine | Admitting: Emergency Medicine

## 2014-10-22 ENCOUNTER — Encounter (HOSPITAL_COMMUNITY): Payer: Self-pay | Admitting: Emergency Medicine

## 2014-10-22 DIAGNOSIS — K5730 Diverticulosis of large intestine without perforation or abscess without bleeding: Secondary | ICD-10-CM | POA: Diagnosis not present

## 2014-10-22 DIAGNOSIS — Z8619 Personal history of other infectious and parasitic diseases: Secondary | ICD-10-CM | POA: Insufficient documentation

## 2014-10-22 DIAGNOSIS — Z9049 Acquired absence of other specified parts of digestive tract: Secondary | ICD-10-CM | POA: Insufficient documentation

## 2014-10-22 DIAGNOSIS — R109 Unspecified abdominal pain: Secondary | ICD-10-CM

## 2014-10-22 DIAGNOSIS — Z862 Personal history of diseases of the blood and blood-forming organs and certain disorders involving the immune mechanism: Secondary | ICD-10-CM | POA: Insufficient documentation

## 2014-10-22 DIAGNOSIS — Z9851 Tubal ligation status: Secondary | ICD-10-CM | POA: Diagnosis not present

## 2014-10-22 DIAGNOSIS — R140 Abdominal distension (gaseous): Secondary | ICD-10-CM

## 2014-10-22 DIAGNOSIS — Z3202 Encounter for pregnancy test, result negative: Secondary | ICD-10-CM | POA: Insufficient documentation

## 2014-10-22 DIAGNOSIS — K5790 Diverticulosis of intestine, part unspecified, without perforation or abscess without bleeding: Secondary | ICD-10-CM

## 2014-10-22 DIAGNOSIS — Z87448 Personal history of other diseases of urinary system: Secondary | ICD-10-CM | POA: Diagnosis not present

## 2014-10-22 LAB — CBC WITH DIFFERENTIAL/PLATELET
BASOS ABS: 0 10*3/uL (ref 0.0–0.1)
BASOS PCT: 0 % (ref 0–1)
EOS PCT: 1 % (ref 0–5)
Eosinophils Absolute: 0.1 10*3/uL (ref 0.0–0.7)
HCT: 36.1 % (ref 36.0–46.0)
Hemoglobin: 11.8 g/dL — ABNORMAL LOW (ref 12.0–15.0)
Lymphocytes Relative: 16 % (ref 12–46)
Lymphs Abs: 2 10*3/uL (ref 0.7–4.0)
MCH: 25.9 pg — AB (ref 26.0–34.0)
MCHC: 32.7 g/dL (ref 30.0–36.0)
MCV: 79.3 fL (ref 78.0–100.0)
Monocytes Absolute: 0.5 10*3/uL (ref 0.1–1.0)
Monocytes Relative: 4 % (ref 3–12)
Neutro Abs: 9.8 10*3/uL — ABNORMAL HIGH (ref 1.7–7.7)
Neutrophils Relative %: 79 % — ABNORMAL HIGH (ref 43–77)
PLATELETS: 320 10*3/uL (ref 150–400)
RBC: 4.55 MIL/uL (ref 3.87–5.11)
RDW: 14.7 % (ref 11.5–15.5)
WBC: 12.4 10*3/uL — ABNORMAL HIGH (ref 4.0–10.5)

## 2014-10-22 LAB — COMPREHENSIVE METABOLIC PANEL
ALBUMIN: 3.8 g/dL (ref 3.5–5.0)
ALT: 20 U/L (ref 14–54)
AST: 24 U/L (ref 15–41)
Alkaline Phosphatase: 60 U/L (ref 38–126)
Anion gap: 12 (ref 5–15)
BILIRUBIN TOTAL: 0.6 mg/dL (ref 0.3–1.2)
BUN: 5 mg/dL — AB (ref 6–20)
CO2: 23 mmol/L (ref 22–32)
CREATININE: 0.7 mg/dL (ref 0.44–1.00)
Calcium: 9.1 mg/dL (ref 8.9–10.3)
Chloride: 103 mmol/L (ref 101–111)
GFR calc Af Amer: >60 mL/min (ref >60)
GFR calc non Af Amer: >60 mL/min (ref >60)
Glucose, Bld: 116 mg/dL — ABNORMAL HIGH (ref 70–99)
Potassium: 3.5 mmol/L (ref 3.5–5.1)
Sodium: 138 mmol/L (ref 135–145)
Total Protein: 7.6 g/dL (ref 6.5–8.1)

## 2014-10-22 LAB — URINALYSIS, ROUTINE W REFLEX MICROSCOPIC
Bilirubin Urine: NEGATIVE
Glucose, UA: NEGATIVE mg/dL
Ketones, ur: 15 mg/dL — AB
LEUKOCYTES UA: NEGATIVE
NITRITE: NEGATIVE
Protein, ur: NEGATIVE mg/dL
SPECIFIC GRAVITY, URINE: 1.028 (ref 1.005–1.030)
Urobilinogen, UA: 0.2 mg/dL (ref 0.0–1.0)
pH: 5.5 (ref 5.0–8.0)

## 2014-10-22 LAB — URINE MICROSCOPIC-ADD ON

## 2014-10-22 LAB — TROPONIN I: Troponin I: <0.03 ng/mL (ref <0.031)

## 2014-10-22 LAB — LIPASE, BLOOD: Lipase: 22 U/L (ref 22–51)

## 2014-10-22 LAB — POC URINE PREG, ED: PREG TEST UR: NEGATIVE

## 2014-10-22 MED ORDER — IOHEXOL 300 MG/ML  SOLN: 100.0000 mL | Freq: Once | INTRAMUSCULAR | Status: AC | PRN

## 2014-10-22 MED ORDER — HYDROMORPHONE HCL 1 MG/ML IJ SOLN
1.0000 mg | Freq: Once | INTRAMUSCULAR | Status: AC
  Administered 2014-10-22: 1 mg via INTRAVENOUS
  Filled 2014-10-22: qty 1

## 2014-10-22 MED ORDER — SODIUM CHLORIDE 0.9 % IV BOLUS (SEPSIS)
1000.0000 mL | INTRAVENOUS | Status: AC
  Administered 2014-10-22: 1000 mL via INTRAVENOUS

## 2014-10-22 MED ORDER — METOCLOPRAMIDE HCL 5 MG/ML IJ SOLN
10.0000 mg | Freq: Once | INTRAMUSCULAR | Status: AC
  Administered 2014-10-23: 10 mg via INTRAVENOUS
  Filled 2014-10-22: qty 2

## 2014-10-22 MED ORDER — IOHEXOL 300 MG/ML  SOLN
25.0000 mL | Freq: Once | INTRAMUSCULAR | Status: AC | PRN
  Administered 2014-10-22: 25 mL via ORAL

## 2014-10-22 MED ORDER — ONDANSETRON HCL 4 MG/2ML IJ SOLN
4.0000 mg | INTRAMUSCULAR | Status: AC
  Administered 2014-10-22: 4 mg via INTRAVENOUS
  Filled 2014-10-22: qty 2

## 2014-10-22 NOTE — ED Provider Notes (Signed)
CSN: [id number]     Arrival date & time 10/22/14  1959 History   First MD Initiated Contact with Patient 10/22/14 2204     Chief Complaint  Patient presents with  . Abdominal Pain   (Consider location/radiation/quality/duration/timing/severity/associated sxs/prior Treatment) HPI  Phyllis BARRETTO is a 44 yo female presenting with abd pain.  She states this pain began today appr 3 pm. The pain extends from her epigastrium down to her suprapubic abd and radiates to her tailbone.  States the pain has progressively worsened since that time. She reports she has had some nausea associated with the pain and feels like her stomach is distended. She rates her pain currently is a 10 out of 10.   She denies any fevers, chills, vomiting, diarrhea, vaginal symptoms or dysuria.    Past Medical History  Diagnosis Date  . History of chicken pox   . History of rubella   . Anemia   . Menorrhagia   . Increased abdominal girth    Past Surgical History  Procedure Laterality Date  . Cholecystectomy    . Tubal ligation    . Cervial cyst    . Laparoscopy N/A 01/22/2013    Procedure: LAPAROSCOPY OPERATIVE;  Surgeon: Delice Lesch, MD;  Location: [facility];  Service: Gynecology;  Laterality: N/A;  . Chromopertubation Bilateral 01/22/2013    Procedure: CHROMOPERTUBATION;  Surgeon: Delice Lesch, MD;  Location: [facility];  Service: Gynecology;  Laterality: Bilateral;   No family history on file. History  Substance Use Topics  . Smoking status: Never Smoker   . Smokeless tobacco: Never Used  . Alcohol Use: No   OB History    Gravida Para Term Preterm AB TAB SAB Ectopic Multiple Living   4 3   1           Review of Systems  Constitutional: Negative for fever and chills.  HENT: Negative for sore throat.   Eyes: Negative for visual disturbance.  Respiratory: Negative for cough and shortness of breath.   Cardiovascular: Negative for chest pain and leg swelling.  Gastrointestinal: Positive for nausea and  abdominal pain. Negative for vomiting and diarrhea.  Genitourinary: Negative for dysuria.  Musculoskeletal: Negative for myalgias.  Skin: Negative for rash.  Neurological: Negative for weakness, numbness and headaches.      Allergies  Review of patient's allergies indicates no known allergies.  Home Medications   Prior to Admission medications   Medication Sig Start Date End Date Taking? Authorizing Provider  ibuprofen (ADVIL,MOTRIN) 200 MG tablet Take 200 mg by mouth every 6 (six) hours as needed for moderate pain.   Yes Historical Provider, MD  Multiple Vitamin (MULITIVITAMIN WITH MINERALS) TABS Take 1 tablet by mouth daily.     Yes Historical Provider, MD  cephALEXin (KEFLEX) 250 MG capsule Take 1 capsule (250 mg total) by mouth 4 (four) times daily. Patient not taking: Reported on 10/22/2014 06/20/13   Janne Napoleon, NP  ibuprofen (ADVIL,MOTRIN) 600 MG tablet Take 1 tablet (600 mg total) by mouth every 6 (six) hours as needed for pain. Patient not taking: Reported on 10/22/2014 01/22/13   Everett Graff, MD  oxyCODONE-acetaminophen (PERCOCET) 5-325 MG per tablet Take 1-2 tablets by mouth every 6 (six) hours as needed for pain. Patient not taking: Reported on 10/22/2014 01/22/13   Everett Graff, MD   BP 132/76 mmHg  Pulse 64  Temp(Src) 98.4 F (36.9 C) (Oral)  Resp 16  SpO2 100%  LMP 10/15/2014 Physical Exam  Constitutional: She  appears well-developed and well-nourished. No distress.  HENT:  Head: Normocephalic and atraumatic.  Mouth/Throat: Oropharynx is clear and moist. No oropharyngeal exudate.  Eyes: Conjunctivae are normal.  Neck: Neck supple. No thyromegaly present.  Cardiovascular: Normal rate, regular rhythm and intact distal pulses.   Pulmonary/Chest: Effort normal and breath sounds normal. No respiratory distress. She has no wheezes. She has no rales. She exhibits no tenderness.  Abdominal: Soft. She exhibits distension. She exhibits no mass. There is tenderness in the  epigastric area and suprapubic area. There is CVA tenderness. There is no rigidity, no rebound, no guarding, no tenderness at McBurney's point and negative Murphy's sign.    Large, rounded, firm abdomen, TTP at most superior and inferior aspect of distended abdomen. States pain radiates to her tailbone on palpation.    Musculoskeletal: She exhibits no tenderness.  Lymphadenopathy:    She has no cervical adenopathy.  Neurological: She is alert.  Skin: Skin is warm and dry. No rash noted. She is not diaphoretic.  Psychiatric: She has a normal mood and affect.  Nursing note and vitals reviewed.   ED Course  Procedures (including critical care time) Labs Review Labs Reviewed  CBC WITH DIFFERENTIAL/PLATELET - Abnormal; Notable for the following:    WBC 12.4 (*)    Hemoglobin 11.8 (*)    MCH 25.9 (*)    Neutrophils Relative % 79 (*)    Neutro Abs 9.8 (*)    All other components within normal limits  COMPREHENSIVE METABOLIC PANEL - Abnormal; Notable for the following:    Glucose, Bld 116 (*)    BUN 5 (*)    All other components within normal limits  URINALYSIS, ROUTINE W REFLEX MICROSCOPIC - Abnormal; Notable for the following:    APPearance CLOUDY (*)    Hgb urine dipstick TRACE (*)    Ketones, ur 15 (*)    All other components within normal limits  URINE MICROSCOPIC-ADD ON - Abnormal; Notable for the following:    Squamous Epithelial / LPF FEW (*)    Crystals CA OXALATE CRYSTALS (*)    All other components within normal limits  LIPASE, BLOOD  TROPONIN I  POC URINE PREG, ED    Imaging Review Ct Abdomen Pelvis W Contrast  10/23/2014   CLINICAL DATA:  Nausea, vomiting, epigastric pain radiating to back, pelvic pain.  EXAM: CT ABDOMEN AND PELVIS WITH CONTRAST  TECHNIQUE: Multidetector CT imaging of the abdomen and pelvis was performed using the standard protocol following bolus administration of intravenous contrast.  CONTRAST:  125mL OMNIPAQUE IOHEXOL 300 MG/ML  SOLN  COMPARISON:   CT of the abdomen and pelvis June 14, 2011  FINDINGS: LUNG BASES: Included view of the lung bases are clear. Visualized heart and pericardium are unremarkable. Gas distended distal esophagus can be seen with reflux.  SOLID ORGANS: The liver, spleen, pancreas and adrenal glands are unremarkable. Status postcholecystectomy.  GASTROINTESTINAL TRACT: The stomach, small and large bowel are normal in course and caliber without inflammatory changes. Moderate diffuse colonic diverticulosis. Enteric contrast has not yet reached the distal small bowel. Small amount of small bowel feces most consistent with chronic stasis. Normal appendix.  KIDNEYS/ URINARY TRACT: Kidneys are orthotopic, demonstrating symmetric enhancement. No nephrolithiasis, hydronephrosis or solid renal masses. The unopacified ureters are normal in course and caliber. Delayed imaging through the kidneys demonstrates symmetric prompt contrast excretion within the proximal urinary collecting system. Urinary bladder is partially distended and unremarkable.  PERITONEUM/RETROPERITONEUM: Aortoiliac vessels are normal in course and caliber. No  lymphadenopathy by CT size criteria. Internal reproductive organs are unremarkable. Small amount of free fluid in the pelvis may be physiologic. No intraperitoneal free air.  SOFT TISSUE/OSSEOUS STRUCTURES: Mild rectus abdominus diastases with small fat containing malignant ventral hernia.  IMPRESSION: Diverticulosis without CT findings of acute diverticulitis nor acute intra-abdominal/ pelvic process.   Electronically Signed   By: Elon Alas   On: 10/23/2014 00:43     EKG Interpretation None      MDM   Final diagnoses:  Abdominal pain, acute  Abdominal distension  Diverticulosis of intestine without bleeding, unspecified intestinal tract location   44 yo with abd pain and distention, no fever or vomiting. Discussed case with Dr. Reather Converse. CBC, CMP, Lipase, UA and Upreg, NS bolus and pain and nausea  meds ordered. CT abd/pelvis to evaluate for mass or infection. Labs and imaging reviewed. She has a mild leukocytosis but is afebrile. Her CT shows diverticulosis but no acute abnormality.  Pain managed in the ED. Pt is well-appearing, in no acute distress and vital signs reviewed and not concerning.  She appears safe to be discharged.  Discharge include follow-up with her PCP and gynecologist.  Return precautions provided.     10/23/14 0100 10/23/14 0115 10/23/14 0130  BP: 125/60 111/61 107/49  Pulse: 58 52 59  Temp:     TempSrc:     Resp:     SpO2: 98% 99% 100%   Meds given in ED:  Medications  iohexol (OMNIPAQUE) 300 MG/ML solution 100 mL (not administered)  sodium chloride 0.9 % bolus 1,000 mL (0 mLs Intravenous Stopped 10/23/14 0132)  HYDROmorphone (DILAUDID) injection 1 mg (1 mg Intravenous Given 10/22/14 2244)  ondansetron (ZOFRAN) injection 4 mg (4 mg Intravenous Given 10/22/14 2244)  iohexol (OMNIPAQUE) 300 MG/ML solution 25 mL (25 mLs Oral Contrast Given 10/22/14 2249)  metoCLOPramide (REGLAN) injection 10 mg (10 mg Intravenous Given 10/23/14 0026)  iohexol (OMNIPAQUE) 300 MG/ML solution 100 mL (100 mLs Intravenous Contrast Given 10/23/14 0001)    Discharge Medication List as of 10/23/2014  1:41 AM    START taking these medications   Details  HYDROcodone-acetaminophen (NORCO/VICODIN) 5-325 MG per tablet Take 1 tablet by mouth every 4 (four) hours as needed., Starting 10/23/2014, Until Discontinued, Print    ondansetron (ZOFRAN) 4 MG tablet Take 1 tablet (4 mg total) by mouth every 6 (six) hours., Starting 10/23/2014, Until Discontinued, Print    polyethylene glycol powder (GLYCOLAX/MIRALAX) powder Take 17 g by mouth daily. Until daily soft stools  OTC, Starting 10/23/2014, Until Discontinued, Print           Britt Bottom, NP 10/23/14 [place]  Elnora Morrison, MD 10/24/14 [phone]

## 2014-10-22 NOTE — ED Notes (Signed)
Patient transported to CT 

## 2014-10-22 NOTE — ED Notes (Signed)
C/o epigastric pain that radiates to back, pelvic pain, and nausea since this morning.  States pain worse since 3pm.  Denies sob, vomiting, and urinary complaints.

## 2014-10-23 ENCOUNTER — Encounter (HOSPITAL_COMMUNITY): Payer: Self-pay | Admitting: Radiology

## 2014-10-23 MED ORDER — IOHEXOL 300 MG/ML  SOLN
100.0000 mL | Freq: Once | INTRAMUSCULAR | Status: AC | PRN
  Administered 2014-10-23: 100 mL via INTRAVENOUS

## 2014-10-23 MED ORDER — HYDROCODONE-ACETAMINOPHEN 5-325 MG PO TABS: 1.0000 | ORAL_TABLET | ORAL | Status: DC | PRN

## 2014-10-23 MED ORDER — ONDANSETRON HCL 4 MG PO TABS: 4.0000 mg | ORAL_TABLET | Freq: Four times a day (QID) | ORAL | Status: DC

## 2014-10-23 MED ORDER — POLYETHYLENE GLYCOL 3350 17 GM/SCOOP PO POWD: 17.0000 g | Freq: Every day | ORAL | Status: DC

## 2014-10-23 NOTE — Discharge Instructions (Signed)
Please follow the directions provided. Be sure to follow-up with your primary care doctor to ensure you're getting better. If your symptoms worsen or do not improve he may follow-up with the GI doctor listed. Be sure to drink plenty of fluids by mouth to stay well hydrated. Please use 1 capful of MIRALAX daily to help produce soft stools.  He may use the Vicodin as needed for pain. May use the Zofran as needed for nausea. Don't hesitate to return for any new, worsening, or concerning symptoms.   SEEK IMMEDIATE MEDICAL CARE IF:  Your pain does not go away within 2 hours.  You keep throwing up (vomiting).  Your pain is felt only in portions of the abdomen, such as the right side or the left lower portion of the abdomen.  You pass bloody or black tarry stools.

## 2014-10-23 NOTE — ED Notes (Signed)
Pt verbalized understanding of d/c instructions and has no further questions. Pt discharged home with family driving.

## 2014-11-17 ENCOUNTER — Other Ambulatory Visit: Payer: Self-pay | Admitting: Nurse Practitioner

## 2014-11-17 DIAGNOSIS — Z1231 Encounter for screening mammogram for malignant neoplasm of breast: Secondary | ICD-10-CM

## 2014-11-23 ENCOUNTER — Ambulatory Visit
Admission: RE | Admit: 2014-11-23 | Discharge: 2014-11-23 | Disposition: A | Discharge status: Home / Self Care | Payer: Medicaid Other | Source: Ambulatory Visit | Attending: Nurse Practitioner | Admitting: Nurse Practitioner

## 2014-11-23 DIAGNOSIS — Z1231 Encounter for screening mammogram for malignant neoplasm of breast: Secondary | ICD-10-CM

## 2016-02-23 ENCOUNTER — Encounter (HOSPITAL_COMMUNITY): Payer: Self-pay | Admitting: *Deleted

## 2016-02-23 ENCOUNTER — Emergency Department (HOSPITAL_COMMUNITY)
Admission: EM | Admit: 2016-02-23 | Discharge: 2016-02-23 | Disposition: A | Discharge status: Home / Self Care | Payer: Medicaid Other | Attending: Emergency Medicine | Admitting: Emergency Medicine

## 2016-02-23 ENCOUNTER — Emergency Department (HOSPITAL_COMMUNITY): Payer: Medicaid Other

## 2016-02-23 DIAGNOSIS — Z791 Long term (current) use of non-steroidal anti-inflammatories (NSAID): Secondary | ICD-10-CM | POA: Insufficient documentation

## 2016-02-23 DIAGNOSIS — Z79899 Other long term (current) drug therapy: Secondary | ICD-10-CM | POA: Insufficient documentation

## 2016-02-23 DIAGNOSIS — R1032 Left lower quadrant pain: Secondary | ICD-10-CM | POA: Diagnosis present

## 2016-02-23 DIAGNOSIS — N83202 Unspecified ovarian cyst, left side: Secondary | ICD-10-CM

## 2016-02-23 LAB — URINALYSIS, ROUTINE W REFLEX MICROSCOPIC
Bilirubin Urine: NEGATIVE
Glucose, UA: NEGATIVE mg/dL
Hgb urine dipstick: NEGATIVE
Ketones, ur: NEGATIVE mg/dL
LEUKOCYTES UA: NEGATIVE
NITRITE: NEGATIVE
Protein, ur: NEGATIVE mg/dL
SPECIFIC GRAVITY, URINE: 1.017 (ref 1.005–1.030)
pH: 6 (ref 5.0–8.0)

## 2016-02-23 LAB — COMPREHENSIVE METABOLIC PANEL
ALK PHOS: 59 U/L (ref 38–126)
ALT: 22 U/L (ref 14–54)
ANION GAP: 6 (ref 5–15)
AST: 21 U/L (ref 15–41)
Albumin: 3.7 g/dL (ref 3.5–5.0)
BILIRUBIN TOTAL: 1.1 mg/dL (ref 0.3–1.2)
BUN: 8 mg/dL (ref 6–20)
CALCIUM: 9.1 mg/dL (ref 8.9–10.3)
CO2: 26 mmol/L (ref 22–32)
Chloride: 105 mmol/L (ref 101–111)
Creatinine, Ser: 0.71 mg/dL (ref 0.44–1.00)
GFR calc non Af Amer: >60 mL/min (ref >60)
Glucose, Bld: 97 mg/dL (ref 65–99)
Potassium: 4 mmol/L (ref 3.5–5.1)
SODIUM: 137 mmol/L (ref 135–145)
TOTAL PROTEIN: 7.7 g/dL (ref 6.5–8.1)

## 2016-02-23 LAB — CBC
HCT: 39.7 % (ref 36.0–46.0)
HEMOGLOBIN: 12.8 g/dL (ref 12.0–15.0)
MCH: 26 pg (ref 26.0–34.0)
MCHC: 32.2 g/dL (ref 30.0–36.0)
MCV: 80.5 fL (ref 78.0–100.0)
Platelets: 295 10*3/uL (ref 150–400)
RBC: 4.93 MIL/uL (ref 3.87–5.11)
RDW: 15.4 % (ref 11.5–15.5)
WBC: 8.5 10*3/uL (ref 4.0–10.5)

## 2016-02-23 LAB — LIPASE, BLOOD: Lipase: 23 U/L (ref 11–51)

## 2016-02-23 MED ORDER — IOPAMIDOL (ISOVUE-300) INJECTION 61%
100.0000 mL | Freq: Once | INTRAVENOUS | Status: AC | PRN
  Administered 2016-02-23: 100 mL via INTRAVENOUS

## 2016-02-23 NOTE — ED Provider Notes (Signed)
[facility] DEPT Provider Note   CSN: [id number] Arrival date & time: 02/23/16  [id number]     History   Chief Complaint Chief Complaint  Patient presents with  . Abdominal Pain    HPI [name] is a 45 y.o. female.  HPI Patient presents with concern of abdominal pain. There is been present for some time, but over the past weeks has become more persistent, more severe. Patient is diffuse, crampy, sore, sharp, with associated anorexia. No vomiting, no diarrhea. No relief with OTC medication. Patient is notable history of hysterectomy, secondary to fibroids. During this illness, she has not seen her physician, has had no emergency department evaluation.  Past Medical History:  Diagnosis Date  . Anemia   . History of chicken pox   . History of rubella   . Increased abdominal girth   . Menorrhagia     Patient Active Problem List   Diagnosis Date Noted  . Pelvic pain 12/04/2011    Past Surgical History:  Procedure Laterality Date  . CERVIAL CYST    . CHOLECYSTECTOMY    . CHROMOPERTUBATION Bilateral 01/22/2013   Procedure: CHROMOPERTUBATION;  Surgeon: Delice Lesch, MD;  Location: [facility];  Service: Gynecology;  Laterality: Bilateral;  . LAPAROSCOPY N/A 01/22/2013   Procedure: LAPAROSCOPY OPERATIVE;  Surgeon: Delice Lesch, MD;  Location: [facility];  Service: Gynecology;  Laterality: N/A;  . TUBAL LIGATION      OB History    Gravida Para Term Preterm AB Living   4 3     1      SAB TAB Ectopic Multiple Live Births                   Home Medications    Prior to Admission medications   Medication Sig Start Date End Date Taking? Authorizing Provider  ibuprofen (ADVIL,MOTRIN) 200 MG tablet Take 200 mg by mouth every 6 (six) hours as needed for moderate pain.   Yes Historical Provider, MD  Multiple Vitamin (MULITIVITAMIN WITH MINERALS) TABS Take 1 tablet by mouth daily.     Yes Historical Provider, MD    Family History No family history on file.  Social  History Social History  Substance Use Topics  . Smoking status: Never Smoker  . Smokeless tobacco: Never Used  . Alcohol use No     Allergies   Shellfish allergy   Review of Systems Review of Systems  Constitutional:       Per HPI, otherwise negative  HENT:       Per HPI, otherwise negative  Respiratory:       Per HPI, otherwise negative  Cardiovascular:       Per HPI, otherwise negative  Gastrointestinal: Positive for abdominal pain. Negative for vomiting.  Endocrine:       Negative aside from HPI  Genitourinary:       Neg aside from HPI   Musculoskeletal:       Per HPI, otherwise negative  Skin: Negative.   Neurological: Negative for syncope.     Physical Exam Updated Vital Signs BP 111/68 (BP Location: Left Arm)   Pulse 80   Temp 97.9 F (36.6 C) (Oral)   Resp 17   Ht 5\' 3"  (1.6 m)   Wt 212 lb 3.2 oz (96.3 kg)   LMP 10/15/2014 Comment: scheduled for hysterectomy in 1 month  SpO2 100%   BMI 37.59 kg/m   Physical Exam  Constitutional: She is oriented to person, place, and time. She  appears well-developed and well-nourished. No distress.  HENT:  Head: Normocephalic and atraumatic.  Eyes: Conjunctivae and EOM are normal.  Cardiovascular: Normal rate and regular rhythm.   Pulmonary/Chest: Effort normal and breath sounds normal. No stridor. No respiratory distress.  Abdominal: She exhibits no distension.  Patient describes diffuse pain, but no guarding anywhere, no appreciable mass, lesion.  Musculoskeletal: She exhibits no edema.  Neurological: She is alert and oriented to person, place, and time. No cranial nerve deficit.  Skin: Skin is warm and dry.  Psychiatric: She has a normal mood and affect.  Nursing note and vitals reviewed.    ED Treatments / Results  Labs (all labs ordered are listed, but only abnormal results are displayed) Labs Reviewed  URINALYSIS, ROUTINE W REFLEX MICROSCOPIC (NOT AT [facility]) - Abnormal; Notable for the following:        Result Value   APPearance CLOUDY (*)    All other components within normal limits  LIPASE, BLOOD  COMPREHENSIVE METABOLIC PANEL  CBC     Radiology Ct Abdomen Pelvis W Contrast  Result Date: 02/23/2016 CLINICAL DATA:  Persistent generalized abdominal pain for 2 weeks. History of diverticulitis. Prior cholecystectomy. EXAM: CT ABDOMEN AND PELVIS WITH CONTRAST TECHNIQUE: Multidetector CT imaging of the abdomen and pelvis was performed using the standard protocol following bolus administration of intravenous contrast. CONTRAST:  14mL ISOVUE-300 IOPAMIDOL (ISOVUE-300) INJECTION 61% COMPARISON:  10/23/2014 CT abdomen/pelvis. FINDINGS: Lower chest: Left lower lobe 3 mm and 4 mm solid pulmonary nodules are stable since 06/14/2011 and considered benign. Hepatobiliary: Normal liver with no liver mass. Cholecystectomy. No biliary ductal dilatation. Pancreas: Normal, with no mass or duct dilation. Spleen: Normal size. No mass. Adrenals/Urinary Tract: Normal adrenals. Normal kidneys with no hydronephrosis and no renal mass. Normal bladder. Stomach/Bowel: Small hiatal hernia. Otherwise grossly normal stomach. Normal caliber small bowel with no small bowel wall thickening. Normal appendix. There is moderate diverticulosis scattered throughout the colon, with no large bowel wall thickening or pericolonic fat stranding. Vascular/Lymphatic: Normal caliber abdominal aorta. Patent portal, splenic, hepatic and renal veins. No pathologically enlarged lymph nodes in the abdomen or pelvis. Reproductive: The uterus is surgically absent. No mass or focal fluid collection at the vaginal cuff. No right adnexal mass. The left ovary is asymmetrically enlarged and contains separate simple 3.1 cm and 2.7 cm cysts and a separate 1.5 cm cyst with slightly thickened hyperdense wall. Other: No pneumoperitoneum, ascites or focal fluid collection. Musculoskeletal: No aggressive appearing focal osseous lesions. IMPRESSION: 1. Diffuse colonic  diverticulosis, with no evidence of acute diverticulitis. No evidence of bowel obstruction or acute bowel inflammation. Normal appendix. 2. At least 3 probably benign left ovarian cysts measuring up to 3.1 cm, the smallest of which is mildly complex. A follow-up transabdominal and transvaginal pelvic ultrasound is recommended in 6-12 weeks. This recommendation follows ACR consensus guidelines: White Paper of the ACR Incidental Findings Committee II on Adnexal Findings. J Am Coll Radiol 2013:10:675-681. 3. Small hiatal hernia. Electronically Signed   By: Ilona Sorrel M.D.   On: 02/23/2016 12:22    Procedures Procedures (including critical care time)  Medications Ordered in ED Medications  iopamidol (ISOVUE-300) 61 % injection 100 mL (100 mLs Intravenous Contrast Given 02/23/16 1151)     Initial Impression / Assessment and Plan / ED Course  I have reviewed the triage vital signs and the nursing notes.  Pertinent labs & imaging results that were available during my care of the patient were reviewed by me and considered in  my medical decision making (see chart for details).  Clinical Course    On repeat exam the patient is in no distress. We discussed all findings at length. Patient will follow up with gynecology.  Final Clinical Impressions(s) / ED Diagnoses  Asian presents with ongoing abdominal pain per Patient is a notable history of prior hysterectomy, otherwise is generally well. Given the description of increasing severity of pain, particular on the left side, the patient had CT scan performed, labs performed. Findings consistent with multiple ovarian cysts, no evidence for hemorrhage, peritonitis, other acute abdominal processes. Patient discharged in stable condition to follow-up with gynecology.   Carmin Muskrat, MD 02/23/16 1228

## 2016-02-23 NOTE — ED Triage Notes (Signed)
Pt reports constant generalized abd pain and pelvic x 2 weeks.  States she has had this same pain since April with Diverticulitis.  States she does not think that this is it this time.  Denies any n/v/d.

## 2016-02-23 NOTE — Discharge Instructions (Signed)
Today's evaluation has demonstrated the presence of multiple left-sided ovarian cysts. These are likely contributing to your ongoing abdominal pain. The report from the CT scan is below  IMPRESSION: 1. Diffuse colonic diverticulosis, with no evidence of acute diverticulitis. No evidence of bowel obstruction or acute bowel inflammation. Normal appendix. 2. At least 3 probably benign left ovarian cysts measuring up to 3.1 cm, the smallest of which is mildly complex. A follow-up transabdominal and transvaginal pelvic ultrasound is recommended in 6-12 weeks. This recommendation follows ACR consensus guidelines: White Paper of the ACR Incidental Findings Committee II on Adnexal Findings. J Am Coll Radiol 2013:10:675-681. 3. Small hiatal hernia.

## 2016-10-20 IMAGING — CT CT ABD-PELV W/ CM
2 of 5 series · 16 of 46 positions shown, 18 images · IV contrast (ISOVUE)
Comparison: 10/23/2014 CT abdomen/pelvis.

CLINICAL DATA: Persistent generalized abdominal pain for 2 weeks.
History of diverticulitis. Prior cholecystectomy.

EXAM:
CT ABDOMEN AND PELVIS WITH CONTRAST
TECHNIQUE: Multidetector CT imaging of the abdomen and pelvis was performed
using the standard protocol following bolus administration of
intravenous contrast.
CONTRAST:  100mL 6QB5T2-XOO IOPAMIDOL (6QB5T2-XOO) INJECTION 61%

[Series 2: abd/pel with · axial · 0.76mm/px · z∈[+880,+1236]mm · 13 of 83 slices shown, 15 images]
[im 6/83  soft-tissue]
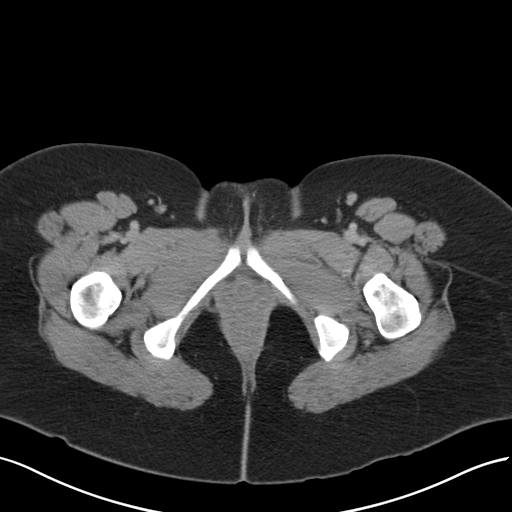
[im 6/83  bone]
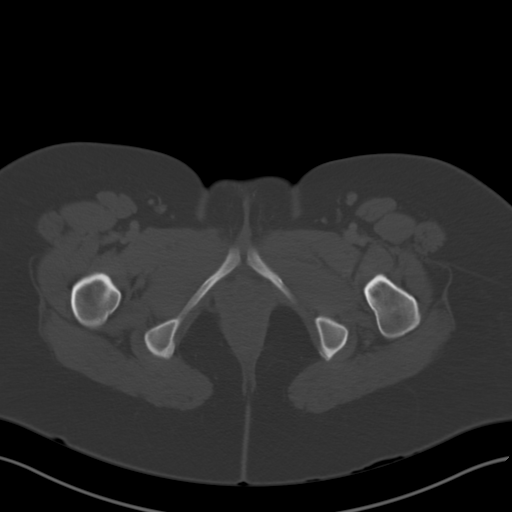
[im 12/83  soft-tissue]
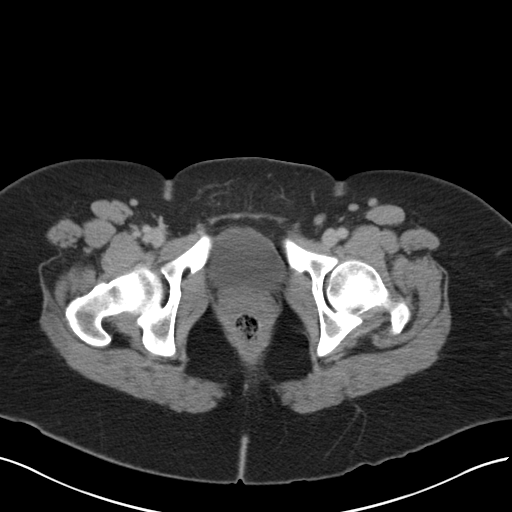
[im 18/83  soft-tissue]
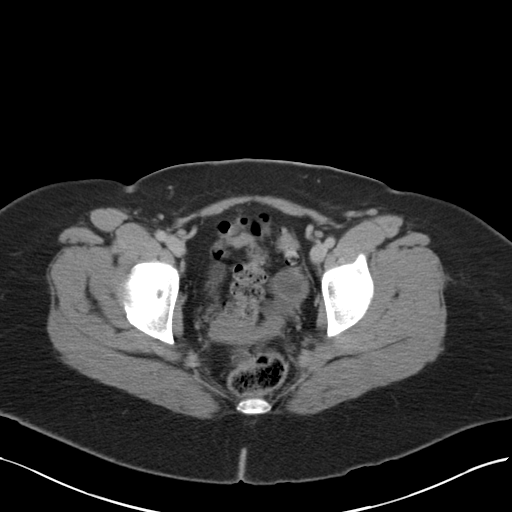
[im 24/83  soft-tissue]
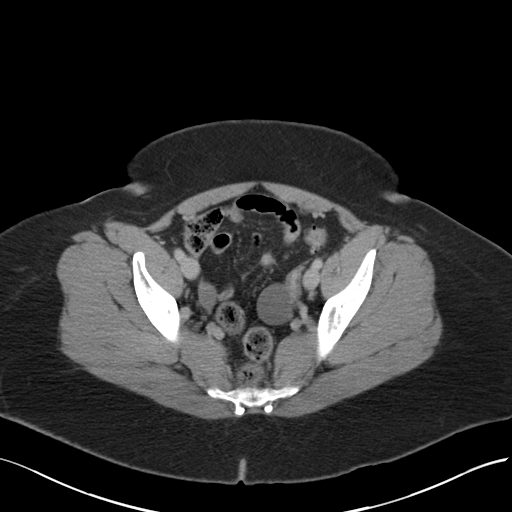
[im 30/83  soft-tissue]
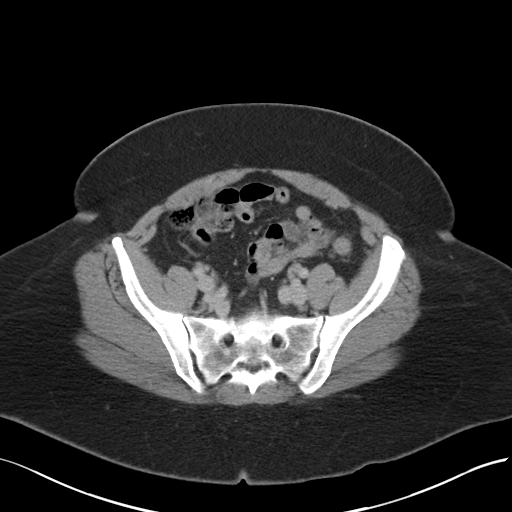
[im 36/83  soft-tissue]
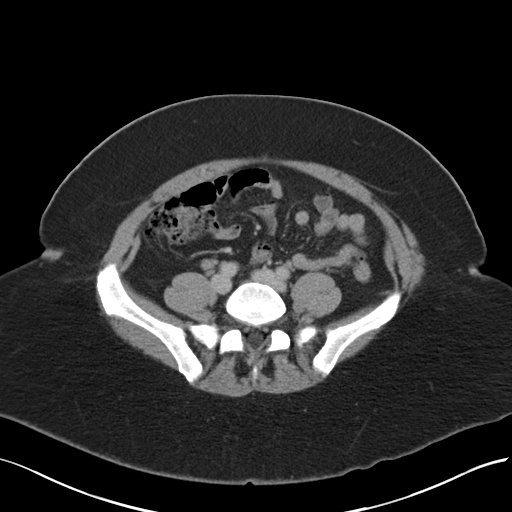
[im 42/83  soft-tissue]
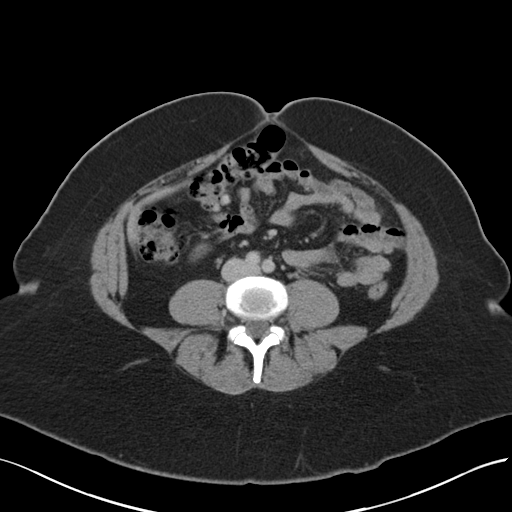
[im 47/83  soft-tissue]
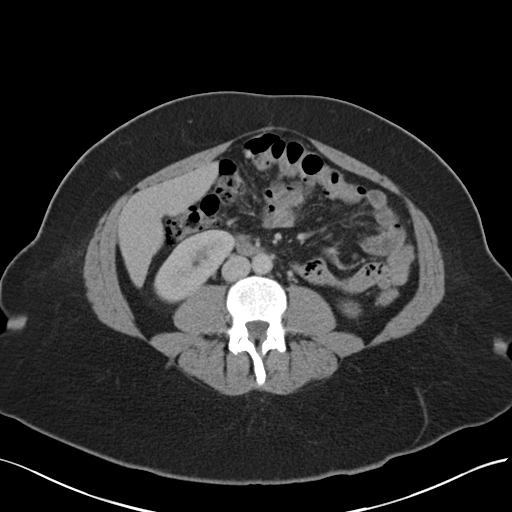
[im 53/83  soft-tissue]
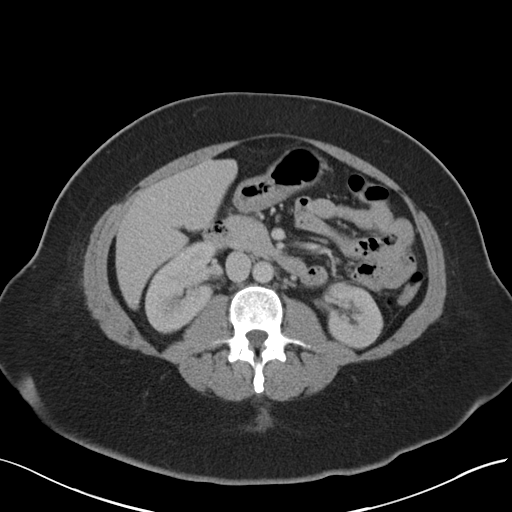
[im 53/83  bone]
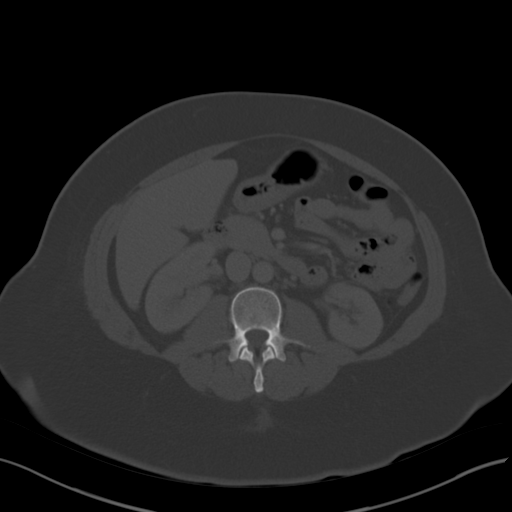
[im 59/83  soft-tissue]
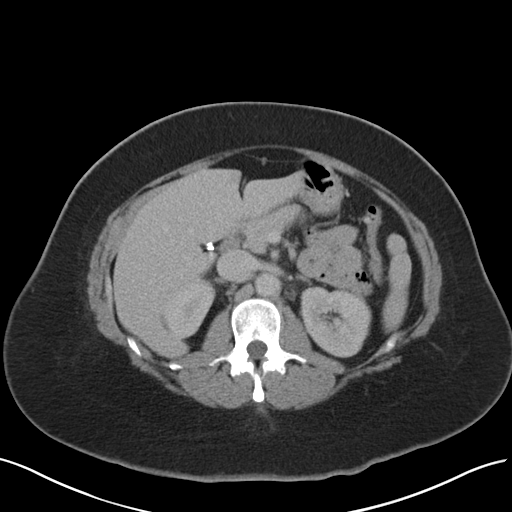
[im 65/83  soft-tissue]
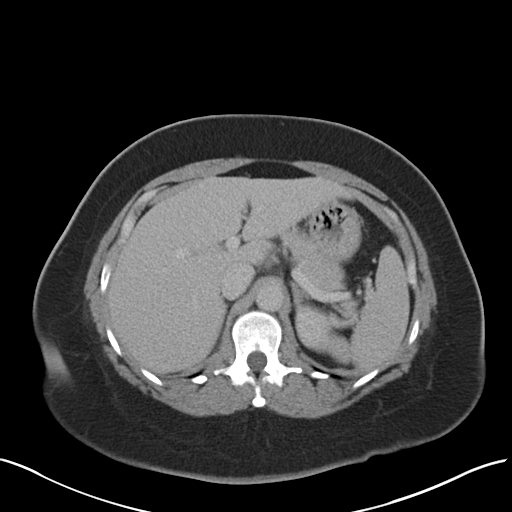
[im 71/83  soft-tissue]
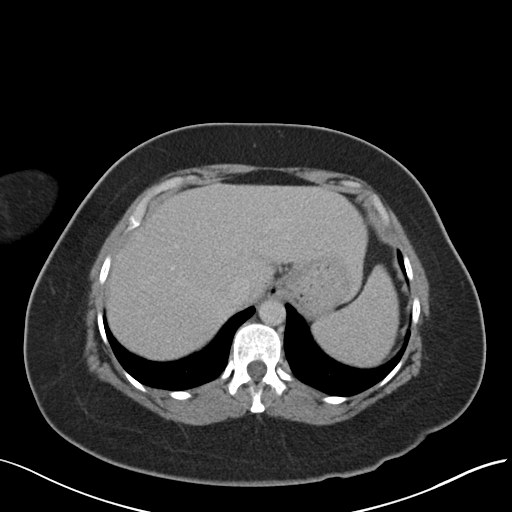
[im 77/83  soft-tissue]
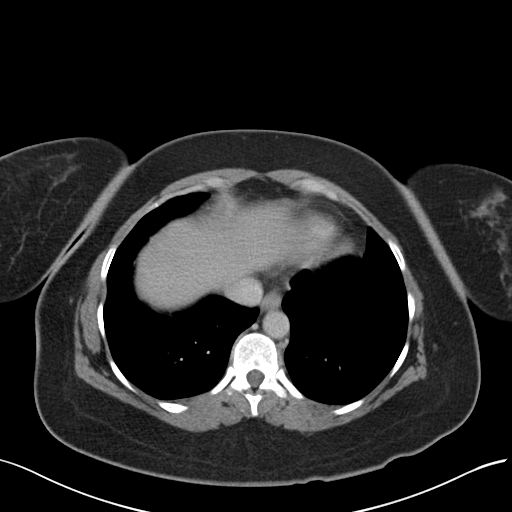

[Series 3: coronal a/|p · coronal · 0.87mm/px · 3 of 151 slices shown]
[im 51/151  soft-tissue]
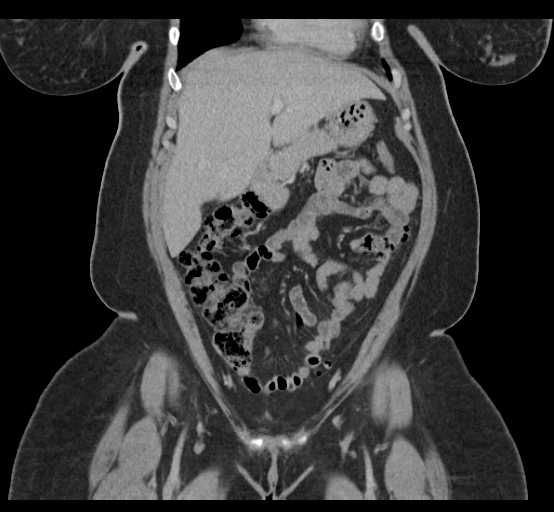
[im 67/151  soft-tissue]
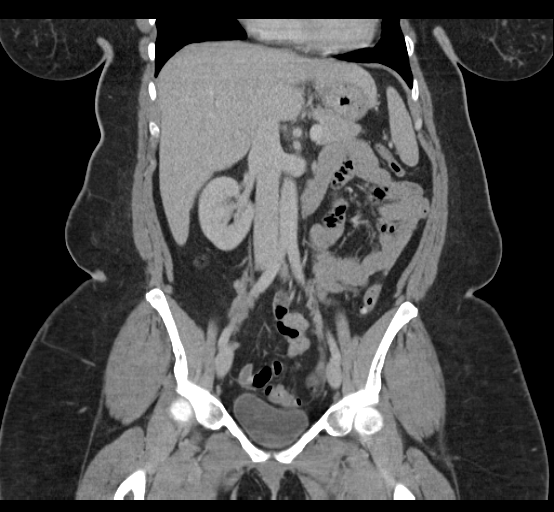
[im 84/151  soft-tissue]
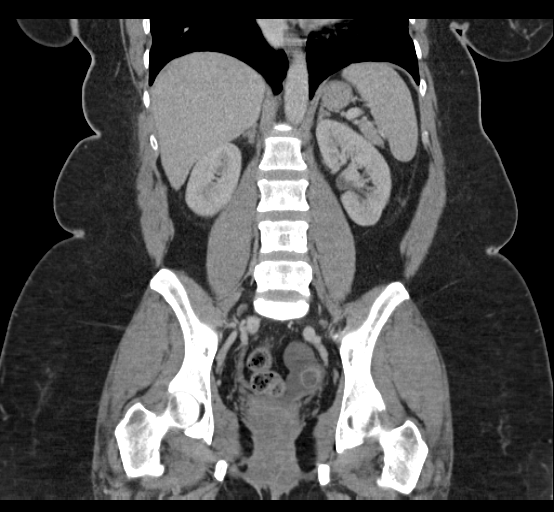

[16 of 46 positions shown; findings below may reference images not displayed]

FINDINGS: Lower chest: Left lower lobe 3 mm and 4 mm solid pulmonary nodules
are stable since 06/14/2011 and considered benign.

Hepatobiliary: Normal liver with no liver mass. Cholecystectomy. No
biliary ductal dilatation.

Pancreas: Normal, with no mass or duct dilation.

Spleen: Normal size. No mass.

Adrenals/Urinary Tract: Normal adrenals. Normal kidneys with no
hydronephrosis and no renal mass. Normal bladder.

Stomach/Bowel: Small hiatal hernia. Otherwise grossly normal
stomach. Normal caliber small bowel with no small bowel wall
thickening. Normal appendix. There is moderate diverticulosis
scattered throughout the colon, with no large bowel wall thickening
or pericolonic fat stranding.

Vascular/Lymphatic: Normal caliber abdominal aorta. Patent portal,
splenic, hepatic and renal veins. No pathologically enlarged lymph
nodes in the abdomen or pelvis.

Reproductive: The uterus is surgically absent. No mass or focal
fluid collection at the vaginal cuff. No right adnexal mass. The
left ovary is asymmetrically enlarged and contains separate simple
3.1 cm and 2.7 cm cysts and a separate 1.5 cm cyst with slightly
thickened hyperdense wall.

Other: No pneumoperitoneum, ascites or focal fluid collection.

Musculoskeletal: No aggressive appearing focal osseous lesions.
IMPRESSION: 1. Diffuse colonic diverticulosis, with no evidence of acute
diverticulitis. No evidence of bowel obstruction or acute bowel
inflammation. Normal appendix.
2. At least 3 probably benign left ovarian cysts measuring up to
cm, the smallest of which is mildly complex. A follow-up
transabdominal and transvaginal pelvic ultrasound is recommended in
6-12 weeks. This recommendation follows ACR consensus guidelines:
White Paper of the ACR Incidental Findings Committee II on Adnexal
Findings. [HOSPITAL] [DATE].
3. Small hiatal hernia.

## 2017-03-11 ENCOUNTER — Emergency Department (HOSPITAL_COMMUNITY): Payer: Medicaid Other

## 2017-03-11 ENCOUNTER — Emergency Department (HOSPITAL_COMMUNITY)
Admission: EM | Admit: 2017-03-11 | Discharge: 2017-03-11 | Disposition: A | Discharge status: Home / Self Care | Payer: Medicaid Other | Attending: Emergency Medicine | Admitting: Emergency Medicine

## 2017-03-11 ENCOUNTER — Encounter (HOSPITAL_COMMUNITY): Payer: Self-pay | Admitting: Emergency Medicine

## 2017-03-11 DIAGNOSIS — R52 Pain, unspecified: Secondary | ICD-10-CM

## 2017-03-11 DIAGNOSIS — Z91013 Allergy to seafood: Secondary | ICD-10-CM | POA: Diagnosis not present

## 2017-03-11 DIAGNOSIS — N83292 Other ovarian cyst, left side: Secondary | ICD-10-CM | POA: Diagnosis not present

## 2017-03-11 DIAGNOSIS — M7918 Myalgia, other site: Secondary | ICD-10-CM

## 2017-03-11 DIAGNOSIS — R1031 Right lower quadrant pain: Secondary | ICD-10-CM | POA: Diagnosis present

## 2017-03-11 DIAGNOSIS — R1030 Lower abdominal pain, unspecified: Secondary | ICD-10-CM | POA: Diagnosis not present

## 2017-03-11 LAB — COMPREHENSIVE METABOLIC PANEL
ALK PHOS: 62 U/L (ref 38–126)
ALT: 21 U/L (ref 14–54)
AST: 22 U/L (ref 15–41)
Albumin: 3.8 g/dL (ref 3.5–5.0)
Anion gap: 5 (ref 5–15)
BILIRUBIN TOTAL: 0.7 mg/dL (ref 0.3–1.2)
BUN: 5 mg/dL — ABNORMAL LOW (ref 6–20)
CALCIUM: 9.2 mg/dL (ref 8.9–10.3)
CO2: 27 mmol/L (ref 22–32)
CREATININE: 0.76 mg/dL (ref 0.44–1.00)
Chloride: 105 mmol/L (ref 101–111)
GFR calc non Af Amer: >60 mL/min (ref >60)
Glucose, Bld: 92 mg/dL (ref 65–99)
Potassium: 3.9 mmol/L (ref 3.5–5.1)
SODIUM: 137 mmol/L (ref 135–145)
TOTAL PROTEIN: 7.6 g/dL (ref 6.5–8.1)

## 2017-03-11 LAB — URINALYSIS, ROUTINE W REFLEX MICROSCOPIC
BILIRUBIN URINE: NEGATIVE
Glucose, UA: NEGATIVE mg/dL
KETONES UR: NEGATIVE mg/dL
LEUKOCYTES UA: NEGATIVE
Nitrite: NEGATIVE
PROTEIN: NEGATIVE mg/dL
Specific Gravity, Urine: 1.014 (ref 1.005–1.030)
pH: 7 (ref 5.0–8.0)

## 2017-03-11 LAB — CBC
HEMATOCRIT: 41.4 % (ref 36.0–46.0)
HEMOGLOBIN: 13.4 g/dL (ref 12.0–15.0)
MCH: 26.3 pg (ref 26.0–34.0)
MCHC: 32.4 g/dL (ref 30.0–36.0)
MCV: 81.2 fL (ref 78.0–100.0)
Platelets: 296 10*3/uL (ref 150–400)
RBC: 5.1 MIL/uL (ref 3.87–5.11)
RDW: 14 % (ref 11.5–15.5)
WBC: 10.3 10*3/uL (ref 4.0–10.5)

## 2017-03-11 LAB — LIPASE, BLOOD: Lipase: 26 U/L (ref 11–51)

## 2017-03-11 MED ORDER — IBUPROFEN 800 MG PO TABS
800.0000 mg | ORAL_TABLET | Freq: Once | ORAL | Status: AC
  Administered 2017-03-11: 800 mg via ORAL
  Filled 2017-03-11: qty 1

## 2017-03-11 MED ORDER — IOPAMIDOL (ISOVUE-300) INJECTION 61%
INTRAVENOUS | Status: AC
  Administered 2017-03-11: 100 mL via INTRAVENOUS
  Filled 2017-03-11: qty 100

## 2017-03-11 NOTE — ED Notes (Signed)
Pt taken to US

## 2017-03-11 NOTE — ED Triage Notes (Signed)
Pt to ER for evaluation of RLQ abd pain and pelvic pain onset one month ago. States right groin pain onset one week ago. Denies nausea, vomiting, diarrhea. Denies urinary symptoms, denies vaginal discharge.

## 2017-03-11 NOTE — ED Provider Notes (Signed)
[facility] DEPT Provider Note   CSN: [id number] Arrival date & time: 03/11/17  4098  History   Chief Complaint Chief Complaint  Patient presents with  . Abdominal Pain    HPI [name] is a 46 y.o. female.  HPI   Pt has PMH of chromopertubation, cholecystectomy, diverticulosis, laparoscopic hysterectomy with retained ovaries (endometriosis/fibroids), hx of anemia, chicken pox and rubella. Per chart review she has been seen for suprapubic discomfort a few months ago.  She comes ot the ER today for evaluation of diffuse abdominal pain abdominal pain for 1 month. She says that she developed RLQ abdominal /groin/upper thigh pain last night. Has been constant but waxes and wanes. She had a hard time "walking" due to the pain.  No dysuria, vaginal discharge, N/V/D, fevers, weakness. + back pain.  Past Medical History:  Diagnosis Date  . Anemia   . History of chicken pox   . History of rubella   . Increased abdominal girth   . Menorrhagia     Patient Active Problem List   Diagnosis Date Noted  . Pelvic pain 12/04/2011    Past Surgical History:  Procedure Laterality Date  . CERVIAL CYST    . CHOLECYSTECTOMY    . CHROMOPERTUBATION Bilateral 01/22/2013   Procedure: CHROMOPERTUBATION;  Surgeon: Delice Lesch, MD;  Location: [facility];  Service: Gynecology;  Laterality: Bilateral;  . LAPAROSCOPY N/A 01/22/2013   Procedure: LAPAROSCOPY OPERATIVE;  Surgeon: Delice Lesch, MD;  Location: [facility];  Service: Gynecology;  Laterality: N/A;  . TUBAL LIGATION      OB History    Gravida Para Term Preterm AB Living   4 3     1      SAB TAB Ectopic Multiple Live Births                   Home Medications    Prior to Admission medications   Medication Sig Start Date End Date Taking? Authorizing Provider  ibuprofen (ADVIL,MOTRIN) 200 MG tablet Take 200 mg by mouth every 6 (six) hours as needed for moderate pain.    [provider]  Multiple Vitamin (MULITIVITAMIN  WITH MINERALS) TABS Take 1 tablet by mouth daily.      [provider]    Family History History reviewed. No pertinent family history.  Social History Social History  Substance Use Topics  . Smoking status: Never Smoker  . Smokeless tobacco: Never Used  . Alcohol use No     Allergies   Shellfish allergy   Review of Systems Review of Systems Negative ROS aside from pertinent positives and negatives as listed in HPI   Physical Exam Updated Vital Signs BP 106/84   Pulse 75   Temp 98.4 F (36.9 C) (Oral)   Resp 16   LMP 10/15/2014 Comment: scheduled for hysterectomy in 1 month  SpO2 100%   Physical Exam  Constitutional: She appears well-developed and well-nourished.  HENT:  Head: Normocephalic and atraumatic.  Eyes: Pupils are equal, round, and reactive to light. Conjunctivae are normal.  Neck: Trachea normal, normal range of motion and full passive range of motion without pain. Neck supple.  Cardiovascular: Normal rate, regular rhythm and normal pulses.   Pulmonary/Chest: Effort normal and breath sounds normal. Chest wall is not dull to percussion. She exhibits no tenderness, no crepitus, no edema, no deformity and no retraction.  Abdominal: Soft. Normal appearance and bowel sounds are normal. She exhibits no distension and no mass. There is no guarding,  no CVA tenderness and negative Murphy's sign.  + obese abdomen Tender to the touch diffusely. Worse to RLQ. Only mild suprapubic main  Musculoskeletal: Normal range of motion.  Neurological: She is alert. She has normal strength.  Skin: Skin is warm, dry and intact.  Psychiatric: She has a normal mood and affect. Her speech is normal and behavior is normal. Judgment and thought content normal. Cognition and memory are normal.      ED Treatments / Results  Labs (all labs ordered are listed, but only abnormal results are displayed) Labs Reviewed  COMPREHENSIVE METABOLIC PANEL - Abnormal; Notable for the  following:       Result Value   BUN 5 (*)    All other components within normal limits  URINALYSIS, ROUTINE W REFLEX MICROSCOPIC - Abnormal; Notable for the following:    Hgb urine dipstick SMALL (*)    Bacteria, UA RARE (*)    Squamous Epithelial / LPF 0-5 (*)    All other components within normal limits  LIPASE, BLOOD  CBC    EKG  EKG Interpretation None       Radiology No results found.  Procedures Procedures (including critical care time)  Medications Ordered in ED Medications  iopamidol (ISOVUE-300) 61 % injection (not administered)     Initial Impression / Assessment and Plan / ED Course  I have reviewed the triage vital signs and the nursing notes.  Pertinent labs & imaging results that were available during my care of the patient were reviewed by me and considered in my medical decision making (see chart for details).    1:53 pm Urinalysis shows small hgb, CMP and CBC unremarkable.  Will get CT abd/pelvis for further evaluation: her pain has been chronic and she can likely;y be referred to a GI/PCP, it is possible this is MSK in etiology.  Pelvic exam not preformed- pt does not have uterus or cervix. no suprapubic tenderness.   3:00 pm: Payton Emerald, MD has agreed to assume care of the patient. Transvag non OB US is pending as well as CT abd/pelv.  Final Clinical Impressions(s) / ED Diagnoses   Final diagnoses:  None    New Prescriptions New Prescriptions   No medications on file     Delos Haring, Hershal Coria 03/11/17 1501    Dorie Rank, MD 03/12/17 1416

## 2017-03-11 NOTE — Discharge Instructions (Signed)
Please apply ice and/or heat to painful area. Please take ibuprofen/tylenol every 4-6 hours as needed for pain

## 2017-03-11 NOTE — ED Notes (Signed)
Pt returned from CT °

## 2017-03-11 NOTE — ED Provider Notes (Signed)
Pt care assumed from [place], [place]. CT A/P showing colonic diverticulosis without acute diverticulitis and normal appendix. U/S pelvis showing 3.1 cm simple left ovarian cyst and normal right ovary. Patient informed of findings. She will continue ice/heat and ibuprofen/tylenol prn for pain. She will f/u with PCP at first availability.   Return precautions provided for worsening symptoms. Pt will f/u with PCP at first availability. Pt verbalized agreement with plan.    Payton Emerald, MD 03/11/17 2326    Forde Dandy, MD 03/11/17 [phone]

## 2018-06-01 ENCOUNTER — Other Ambulatory Visit: Payer: Self-pay

## 2018-06-01 ENCOUNTER — Encounter (HOSPITAL_COMMUNITY): Payer: Self-pay | Admitting: *Deleted

## 2018-06-01 ENCOUNTER — Emergency Department (HOSPITAL_COMMUNITY)
Admission: EM | Admit: 2018-06-01 | Discharge: 2018-06-01 | Disposition: A | Discharge status: Home / Self Care | Payer: Medicaid Other | Attending: Emergency Medicine | Admitting: Emergency Medicine

## 2018-06-01 DIAGNOSIS — R1084 Generalized abdominal pain: Secondary | ICD-10-CM

## 2018-06-01 DIAGNOSIS — N760 Acute vaginitis: Secondary | ICD-10-CM | POA: Insufficient documentation

## 2018-06-01 DIAGNOSIS — B9689 Other specified bacterial agents as the cause of diseases classified elsewhere: Secondary | ICD-10-CM

## 2018-06-01 LAB — CBC WITH DIFFERENTIAL/PLATELET
Abs Immature Granulocytes: 0.02 10*3/uL (ref 0.00–0.07)
Basophils Absolute: 0 10*3/uL (ref 0.0–0.1)
Basophils Relative: 0 %
Eosinophils Absolute: 0.2 10*3/uL (ref 0.0–0.5)
Eosinophils Relative: 2 %
HCT: 42.6 % (ref 36.0–46.0)
Hemoglobin: 12.8 g/dL (ref 12.0–15.0)
Immature Granulocytes: 0 %
Lymphocytes Relative: 29 %
Lymphs Abs: 2.5 10*3/uL (ref 0.7–4.0)
MCH: 25.1 pg — ABNORMAL LOW (ref 26.0–34.0)
MCHC: 30 g/dL (ref 30.0–36.0)
MCV: 83.7 fL (ref 80.0–100.0)
Monocytes Absolute: 0.5 10*3/uL (ref 0.1–1.0)
Monocytes Relative: 6 %
NEUTROS ABS: 5.5 10*3/uL (ref 1.7–7.7)
Neutrophils Relative %: 63 %
Platelets: 333 10*3/uL (ref 150–400)
RBC: 5.09 MIL/uL (ref 3.87–5.11)
RDW: 14.5 % (ref 11.5–15.5)
WBC: 8.8 10*3/uL (ref 4.0–10.5)
nRBC: 0 % (ref 0.0–0.2)

## 2018-06-01 LAB — PREGNANCY, URINE: Preg Test, Ur: NEGATIVE

## 2018-06-01 LAB — URINALYSIS, ROUTINE W REFLEX MICROSCOPIC
Bilirubin Urine: NEGATIVE
Glucose, UA: NEGATIVE mg/dL
KETONES UR: NEGATIVE mg/dL
Leukocytes, UA: NEGATIVE
Nitrite: NEGATIVE
Protein, ur: NEGATIVE mg/dL
Specific Gravity, Urine: 1.012 (ref 1.005–1.030)
pH: 6 (ref 5.0–8.0)

## 2018-06-01 LAB — COMPREHENSIVE METABOLIC PANEL
ALT: 27 U/L (ref 0–44)
AST: 21 U/L (ref 15–41)
Albumin: 3.6 g/dL (ref 3.5–5.0)
Alkaline Phosphatase: 58 U/L (ref 38–126)
Anion gap: 10 (ref 5–15)
BUN: 8 mg/dL (ref 6–20)
CO2: 26 mmol/L (ref 22–32)
Calcium: 9 mg/dL (ref 8.9–10.3)
Chloride: 104 mmol/L (ref 98–111)
Creatinine, Ser: 0.83 mg/dL (ref 0.44–1.00)
GFR calc Af Amer: >60 mL/min (ref >60)
GFR calc non Af Amer: >60 mL/min (ref >60)
Glucose, Bld: 85 mg/dL (ref 70–99)
Potassium: 3.5 mmol/L (ref 3.5–5.1)
Sodium: 140 mmol/L (ref 135–145)
Total Bilirubin: 0.7 mg/dL (ref 0.3–1.2)
Total Protein: 7.6 g/dL (ref 6.5–8.1)

## 2018-06-01 LAB — WET PREP, GENITAL
Sperm: NONE SEEN
Trich, Wet Prep: NONE SEEN
Yeast Wet Prep HPF POC: NONE SEEN

## 2018-06-01 LAB — LIPASE, BLOOD: LIPASE: 32 U/L (ref 11–51)

## 2018-06-01 MED ORDER — METRONIDAZOLE 500 MG PO TABS: 500.0000 mg | ORAL_TABLET | Freq: Two times a day (BID) | ORAL | 0 refills | Status: AC

## 2018-06-01 MED ORDER — FLUCONAZOLE 200 MG PO TABS: 200.0000 mg | ORAL_TABLET | Freq: Once | ORAL | 1 refills | Status: DC | PRN

## 2018-06-01 MED ORDER — PANTOPRAZOLE SODIUM 40 MG PO TBEC: 40.0000 mg | DELAYED_RELEASE_TABLET | Freq: Every day | ORAL | 0 refills | Status: DC

## 2018-06-01 MED ORDER — DICYCLOMINE HCL 20 MG PO TABS: 20.0000 mg | ORAL_TABLET | Freq: Three times a day (TID) | ORAL | 0 refills | Status: DC | PRN

## 2018-06-01 NOTE — ED Triage Notes (Signed)
Pt reports intermittent pain to upper and lower abd x 1 month, radiates around to her back. Denies n/v/d or urinary symptoms.

## 2018-06-01 NOTE — ED Provider Notes (Signed)
Emergency Department Provider Note   I have reviewed the triage vital signs and the nursing notes.   HISTORY  Chief Complaint Abdominal Pain and Back Pain   HPI [name] is a 47 y.o. female with PMH of anemia presents to the emergency department evaluation of intermittent, sharp abdominal pain for the past month.  Patient states that she seen both her PCP and gynecologist for this issue with no clear diagnosis at this time.  She states that her gynecologist performed a pelvic exam including swabs obtained and ultrasound showed a small cyst on left ovary.  She denies any vaginal bleeding or discharge.  She is occasionally sexually active but has no concern for STD exposure.  No fevers or chills.  Denies any nausea, vomiting.  No modifying factors.  Pain occurs primarily in the epigastric region and radiates downward into the back.  No unilateral symptoms or flank discomfort.  No dysuria, hesitancy, urgency.   Past Medical History:  Diagnosis Date  . Anemia   . History of chicken pox   . History of rubella   . Increased abdominal girth   . Menorrhagia     Patient Active Problem List   Diagnosis Date Noted  . Pelvic pain 12/04/2011    Past Surgical History:  Procedure Laterality Date  . CERVIAL CYST    . CHOLECYSTECTOMY    . CHROMOPERTUBATION Bilateral 01/22/2013   Procedure: CHROMOPERTUBATION;  Surgeon: Delice Lesch, MD;  Location: [facility];  Service: Gynecology;  Laterality: Bilateral;  . LAPAROSCOPY N/A 01/22/2013   Procedure: LAPAROSCOPY OPERATIVE;  Surgeon: Delice Lesch, MD;  Location: [facility];  Service: Gynecology;  Laterality: N/A;  . TUBAL LIGATION     Allergies Shellfish allergy  History reviewed. No pertinent family history.  Social History Social History   Tobacco Use  . Smoking status: Never Smoker  . Smokeless tobacco: Never Used  Substance Use Topics  . Alcohol use: No  . Drug use: No    Review of Systems  Constitutional: No  fever/chills Eyes: No visual changes. ENT: No sore throat. Cardiovascular: Denies chest pain. Respiratory: Denies shortness of breath. Gastrointestinal: Positive abdominal pain.  No nausea, no vomiting.  No diarrhea.  No constipation. Genitourinary: Negative for dysuria. Musculoskeletal: Positive for back pain. Skin: Negative for rash. Neurological: Negative for headaches, focal weakness or numbness.  10-point ROS otherwise negative.  ____________________________________________   PHYSICAL EXAM:  VITAL SIGNS: ED Triage Vitals  Enc Vitals Group     BP 06/01/18 1401 (!) 149/80     Pulse Rate 06/01/18 1401 75     Resp 06/01/18 1401 16     Temp 06/01/18 1401 98.3 F (36.8 C)     Temp Source 06/01/18 1401 Oral     SpO2 06/01/18 1401 100 %     Pain Score 06/01/18 1403 5   Constitutional: Alert and oriented. Well appearing and in no acute distress. Eyes: Conjunctivae are normal.  Head: Atraumatic. Nose: No congestion/rhinnorhea. Mouth/Throat: Mucous membranes are moist.  Neck: No stridor.   Cardiovascular: Normal rate, regular rhythm. Good peripheral circulation. Grossly normal heart sounds.   Respiratory: Normal respiratory effort.  No retractions. Lungs CTAB. Gastrointestinal: Soft with mild epigastric tenderness. No rebound or guarding. No Murphy's sign. No distention. Mild suprapubic tenderness noted.  GU: Pelvic performed with nurse chaperone. Normal external genitalia. Mild vaginal discharge (white). No vaginal bleeding. No CMT. No adnexal masses. Mild discomfort diffusely.  Musculoskeletal: No lower extremity tenderness nor edema. No gross  deformities of extremities. Neurologic:  Normal speech and language. No gross focal neurologic deficits are appreciated.  Skin:  Skin is warm, dry and intact. No rash noted.  ____________________________________________   LABS (all labs ordered are listed, but only abnormal results are displayed)  Labs Reviewed  WET PREP, GENITAL -  Abnormal; Notable for the following components:      Result Value   Clue Cells Wet Prep HPF POC PRESENT (*)    WBC, Wet Prep HPF POC FEW (*)    All other components within normal limits  CBC WITH DIFFERENTIAL/PLATELET - Abnormal; Notable for the following components:   MCH 25.1 (*)    All other components within normal limits  URINALYSIS, ROUTINE W REFLEX MICROSCOPIC - Abnormal; Notable for the following components:   Hgb urine dipstick SMALL (*)    Bacteria, UA RARE (*)    All other components within normal limits  URINE CULTURE  COMPREHENSIVE METABOLIC PANEL  LIPASE, BLOOD  PREGNANCY, URINE  GC/CHLAMYDIA PROBE AMP ([place]) NOT AT [facility]   ____________________________________________  RADIOLOGY  No results found.  ____________________________________________   PROCEDURES  Procedure(s) performed:   Procedures  None ____________________________________________   INITIAL IMPRESSION / ASSESSMENT AND PLAN / ED COURSE  Pertinent labs & imaging results that were available during my care of the patient were reviewed by me and considered in my medical decision making (see chart for details).  Patient presents to the emergency department primarily with epigastric pain that radiates downward into the back.  No modifying factors.  Patient with mild tenderness on exam.  No fevers.  Patient's symptoms have been going on for at least the last month.  She has seen a gynecologist who reportedly did a pelvic exam and pelvic ultrasound with only a small, left ovarian cyst identified.  Plan for screening labs and urinalysis.  Will plan on repeat pelvic exam here to rule out PID.  Suspicion for ovarian torsion is extremely low given the location and timing of symptoms.  No focal tenderness to suspect diverticulitis, appendicitis.  Patient has a history of cholecystectomy.   03:45 PM Pelvic with no evidence to suspect PID. Will follow wet prep and reassess.   Wet prep with signs of BV.  Will cover for this with Flagyl. Patient does not drink EtOH. Provided Fluconazole in case yeast infection symptoms develop on abx. Also starting Protonix and bentyl for pain. Advised close PCP follow up. No exam findings of historical features to suspect diverticulitis, appendicitis, or other surgical pathology. Discussed ED return precautions in detail.  ____________________________________________  FINAL CLINICAL IMPRESSION(S) / ED DIAGNOSES  Final diagnoses:  Generalized abdominal pain  Bacterial vaginosis     NEW OUTPATIENT MEDICATIONS STARTED DURING THIS VISIT:  Discharge Medication List as of 06/01/2018  4:24 PM    START taking these medications   Details  dicyclomine (BENTYL) 20 MG tablet Take 1 tablet (20 mg total) by mouth 3 (three) times daily as needed for spasms (abdominal cramping)., Starting Sun 06/01/2018, Print    fluconazole (DIFLUCAN) 200 MG tablet Take 1 tablet (200 mg total) by mouth once as needed for up to 1 dose (if yeast infection symptoms develop. Repeat dose in 48 hours if symptoms continue.)., Starting Sun 06/01/2018, Print    metroNIDAZOLE (FLAGYL) 500 MG tablet Take 1 tablet (500 mg total) by mouth 2 (two) times daily for 7 days., Starting Sun 06/01/2018, Until Sun 06/08/2018, Print    pantoprazole (PROTONIX) 40 MG tablet Take 1 tablet (40 mg total)  by mouth daily., Starting Sun 06/01/2018, Until Tue 07/01/2018, Print        Note:  This document was prepared using Dragon voice recognition software and may include unintentional dictation errors.  Nanda Quinton, MD Emergency Medicine    Shain Pauwels, Wonda Olds, MD 06/01/18 [phone]

## 2018-06-01 NOTE — Discharge Instructions (Signed)
You have been seen in the Emergency Department (ED) for abdominal pain.  Your evaluation did not identify a clear cause of your symptoms but was generally reassuring.  Please follow up as instructed above regarding todays emergent visit and the symptoms that are bothering you.  Take the medication for bacterial vaginosis as directed. Do not drink alcohol while taking this medication as it can cause severe vomiting.   Return to the ED if your abdominal pain worsens or fails to improve, you develop bloody vomiting, bloody diarrhea, you are unable to tolerate fluids due to vomiting, fever greater than 101, or other symptoms that concern you.

## 2018-06-02 LAB — URINE CULTURE: Special Requests: NORMAL

## 2018-06-02 LAB — GC/CHLAMYDIA PROBE AMP (~~LOC~~) NOT AT ARMC
Chlamydia: NEGATIVE
Neisseria Gonorrhea: NEGATIVE

## 2018-10-02 ENCOUNTER — Encounter (HOSPITAL_COMMUNITY): Payer: Self-pay

## 2018-10-02 ENCOUNTER — Emergency Department (HOSPITAL_COMMUNITY)
Admission: EM | Admit: 2018-10-02 | Discharge: 2018-10-02 | Disposition: A | Discharge status: Home / Self Care | Payer: Medicaid Other | Attending: Emergency Medicine | Admitting: Emergency Medicine

## 2018-10-02 ENCOUNTER — Other Ambulatory Visit: Payer: Self-pay

## 2018-10-02 DIAGNOSIS — Z9049 Acquired absence of other specified parts of digestive tract: Secondary | ICD-10-CM | POA: Diagnosis not present

## 2018-10-02 DIAGNOSIS — N939 Abnormal uterine and vaginal bleeding, unspecified: Secondary | ICD-10-CM | POA: Diagnosis not present

## 2018-10-02 DIAGNOSIS — R3129 Other microscopic hematuria: Secondary | ICD-10-CM

## 2018-10-02 LAB — URINALYSIS, ROUTINE W REFLEX MICROSCOPIC
Bacteria, UA: NONE SEEN
Bilirubin Urine: NEGATIVE
Glucose, UA: NEGATIVE mg/dL
Ketones, ur: NEGATIVE mg/dL
Leukocytes,Ua: NEGATIVE
Nitrite: NEGATIVE
Protein, ur: NEGATIVE mg/dL
Specific Gravity, Urine: 1.019 (ref 1.005–1.030)
pH: 6 (ref 5.0–8.0)

## 2018-10-02 LAB — WET PREP, GENITAL
Clue Cells Wet Prep HPF POC: NONE SEEN
Sperm: NONE SEEN
Trich, Wet Prep: NONE SEEN
Yeast Wet Prep HPF POC: NONE SEEN

## 2018-10-02 LAB — CBC WITH DIFFERENTIAL/PLATELET
Abs Immature Granulocytes: 0.01 10*3/uL (ref 0.00–0.07)
Basophils Absolute: 0 10*3/uL (ref 0.0–0.1)
Basophils Relative: 0 %
Eosinophils Absolute: 0.1 10*3/uL (ref 0.0–0.5)
Eosinophils Relative: 2 %
HCT: 41.1 % (ref 36.0–46.0)
Hemoglobin: 12.7 g/dL (ref 12.0–15.0)
Immature Granulocytes: 0 %
Lymphocytes Relative: 26 %
Lymphs Abs: 2.2 10*3/uL (ref 0.7–4.0)
MCH: 25.6 pg — ABNORMAL LOW (ref 26.0–34.0)
MCHC: 30.9 g/dL (ref 30.0–36.0)
MCV: 82.7 fL (ref 80.0–100.0)
Monocytes Absolute: 0.5 10*3/uL (ref 0.1–1.0)
Monocytes Relative: 6 %
Neutro Abs: 5.7 10*3/uL (ref 1.7–7.7)
Neutrophils Relative %: 66 %
Platelets: 272 10*3/uL (ref 150–400)
RBC: 4.97 MIL/uL (ref 3.87–5.11)
RDW: 14.6 % (ref 11.5–15.5)
WBC: 8.5 10*3/uL (ref 4.0–10.5)
nRBC: 0 % (ref 0.0–0.2)

## 2018-10-02 NOTE — ED Provider Notes (Signed)
Y/o F here with "vaginal bleeding" complaint. S/p hysterectomy. No bleeding on exam' Awaiting uA If negative-d/c with gyn f/u  Patient with microscopic hematuria on urinalysis.  Noes evidence of infection.  Status post hysterectomy.  Advised to follow with OB/GYN.   Margarita Mail, PA-C 10/02/18 2248    Lajean Saver, MD 10/05/18 [phone]

## 2018-10-02 NOTE — Discharge Instructions (Addendum)
Please follow up with your OB-GYN in regards to your symptoms today.   Return to the emergency room if you have increased bleeding (going through 1 pad per hour), abdominal pain, persistent vomiting, bloody stools, or fevers.

## 2018-10-02 NOTE — ED Triage Notes (Signed)
Pt reports vaginal bleeding that started this morning. She states some lower back pain that began this morning as well. Pt concerned because she has had hysterectomy.

## 2018-10-02 NOTE — ED Provider Notes (Signed)
[facility] EMERGENCY DEPARTMENT Provider Note   CSN: [id number] Arrival date & time: 10/02/18  1407    History   Chief Complaint Chief Complaint  Patient presents with  . Vaginal Bleeding    HPI [name] is a 48 y.o. female.     HPI   Patient is a 48 year old female with history of anemia, hysterectomy, who presents to the emergency department today for evaluation of vaginal bleeding that began earlier today.  Patient states she use the restroom and when she wiped she noticed bright red blood on the toilet tissue.  She denies hematuria or rectal bleeding.  She is only had this 1 episode of vaginal bleeding today.  No clots or blood in the commode.  She has no abdominal pain, dysuria, frequency, nausea, vomiting, diarrhea or constipation.  No fevers.  Patient is concerned because she states she had hysterectomy and should not be having bleeding.  States she has had minimal vaginal discharge.  She has had unprotected intercourse but not from several weeks.  She is monogamous with her partner.  She denies concern for STD and declines testing.  She has not tried contacting her OB/GYN.  Triage note indicates that patient is also complaining of lower back pain.  Patient states to me that she has had intermittent lower back pain for the last month.  She does not currently have any back pain at all.  No lower extremity numbness/weakness.  No urinary retention.  No loss control of bowel or bladder function.  Past Medical History:  Diagnosis Date  . Anemia   . History of chicken pox   . History of rubella   . Increased abdominal girth   . Menorrhagia     Patient Active Problem List   Diagnosis Date Noted  . Pelvic pain 12/04/2011    Past Surgical History:  Procedure Laterality Date  . ABDOMINAL HYSTERECTOMY    . CERVIAL CYST    . CHOLECYSTECTOMY    . CHROMOPERTUBATION Bilateral 01/22/2013   Procedure: CHROMOPERTUBATION;  Surgeon: Delice Lesch, MD;   Location: [facility];  Service: Gynecology;  Laterality: Bilateral;  . LAPAROSCOPY N/A 01/22/2013   Procedure: LAPAROSCOPY OPERATIVE;  Surgeon: Delice Lesch, MD;  Location: [facility];  Service: Gynecology;  Laterality: N/A;  . TUBAL LIGATION       OB History    Gravida  4   Para  3   Term      Preterm      AB  1   Living        SAB      TAB      Ectopic      Multiple      Live Births               Home Medications    Prior to Admission medications   Medication Sig Start Date End Date Taking? Authorizing Provider  dicyclomine (BENTYL) 20 MG tablet Take 1 tablet (20 mg total) by mouth 3 (three) times daily as needed for spasms (abdominal cramping). Patient not taking: Reported on 10/02/2018 06/01/18   Long, Wonda Olds, MD  fluconazole (DIFLUCAN) 200 MG tablet Take 1 tablet (200 mg total) by mouth once as needed for up to 1 dose (if yeast infection symptoms develop. Repeat dose in 48 hours if symptoms continue.). Patient not taking: Reported on 10/02/2018 06/01/18   Margette Fast, MD  pantoprazole (PROTONIX) 40 MG tablet Take 1 tablet (40 mg total)  by mouth daily. Patient not taking: Reported on 10/02/2018 06/01/18 07/01/18  Long, Wonda Olds, MD    Family History History reviewed. No pertinent family history.  Social History Social History   Tobacco Use  . Smoking status: Never Smoker  . Smokeless tobacco: Never Used  Substance Use Topics  . Alcohol use: No  . Drug use: No     Allergies   Shellfish allergy   Review of Systems Review of Systems  Constitutional: Negative for fever.  HENT: Negative for ear pain and sore throat.   Eyes: Negative for visual disturbance.  Respiratory: Negative for cough.   Cardiovascular: Negative for chest pain.  Gastrointestinal: Negative for abdominal pain, constipation, diarrhea, nausea and vomiting.  Genitourinary: Positive for vaginal bleeding. Negative for dysuria and hematuria.  Musculoskeletal: Positive for back pain.   Skin: Negative for color change and rash.  Neurological: Negative for weakness and numbness.  All other systems reviewed and are negative.   Physical Exam Updated Vital Signs BP (!) 144/77 (BP Location: Right Arm)   Pulse 66   Temp 97.9 F (36.6 C) (Oral)   Resp 18   Ht 5\' 3"  (1.6 m)   Wt 101.2 kg   LMP 10/15/2014 Comment: scheduled for hysterectomy in 1 month  SpO2 100%   BMI 39.50 kg/m   Physical Exam Vitals signs and nursing note reviewed.  Constitutional:      General: She is not in acute distress.    Appearance: She is well-developed.  HENT:     Head: Normocephalic and atraumatic.  Eyes:     Conjunctiva/sclera: Conjunctivae normal.  Neck:     Musculoskeletal: Neck supple.  Cardiovascular:     Rate and Rhythm: Normal rate and regular rhythm.     Heart sounds: No murmur.  Pulmonary:     Effort: Pulmonary effort is normal. No respiratory distress.     Breath sounds: Normal breath sounds.  Abdominal:     Palpations: Abdomen is soft.     Tenderness: There is no abdominal tenderness.  Genitourinary:    Comments: Exam performed by Rodney Booze,  exam chaperoned Date: 10/02/2018 Pelvic exam: normal external genitalia without evidence of trauma. VULVA: normal appearing vulva with no masses, tenderness or lesion. VAGINA: normal appearing vagina with normal color and discharge, no lesions. No blood in the vaginal vault. CERVIX: remaining portion of cervix present? Possible cyst to cervix. No erythema or bleeding. No CMT. vaginal discharge is not present, Wet prep obtained. GC/Chlamydia declined.  ADNEXA:  UTERUS: surgically absent. Minimal tenderness. Skin:    General: Skin is warm and dry.  Neurological:     Mental Status: She is alert.      ED Treatments / Results  Labs (all labs ordered are listed, but only abnormal results are displayed) Labs Reviewed  WET PREP, GENITAL - Abnormal; Notable for the following components:      Result Value   WBC, Wet Prep  HPF POC MODERATE (*)    All other components within normal limits  CBC WITH DIFFERENTIAL/PLATELET - Abnormal; Notable for the following components:   MCH 25.6 (*)    All other components within normal limits  URINALYSIS, ROUTINE W REFLEX MICROSCOPIC    EKG None  Radiology No results found.  Procedures Procedures (including critical care time)  Medications Ordered in ED Medications - No data to display   Initial Impression / Assessment and Plan / ED Course  I have reviewed the triage vital signs and the nursing notes.  Pertinent labs & imaging results that were available during my care of the patient were reviewed by me and considered in my medical decision making (see chart for details).     Final Clinical Impressions(s) / ED Diagnoses   Final diagnoses:  Vaginal bleeding   Pt here with c/o vaginal bleeding. She is s/p hysterectomy. On exam there is no blood noted in the vaginal vault. There is minimal tenderness on exam. No rebound tenderness or guarding. Do not feel imaging is indicated at this time. Wet prep obtained with WBC noted, no yeast, clue cells or trich noted. Pt declines STD testing. CBC with normal hgb. No ongoing bleeding reported.  UA pending at time of shift change. Care signed out to Margarita Mail, [facility] with plan to f/u on UA. If negative, pt can be d/c with outpatient gyn f/u. At this time unclear cause of sxs, but if UA negative pt will need ob-gyn f/u as outpatient.  I discussed this with the patient and she voices understanding.  Gave instructions on return precautions for heavy bleeding.   ED Discharge Orders    None       Bishop Dublin 10/02/18 1632    Lajean Saver, MD 10/02/18 [id number]

## 2018-10-02 NOTE — ED Triage Notes (Signed)
States she started vaginal bleeding onset 1 hour ago. Medium amount of bleeding bright red no clots. C/o lower back pain

## 2018-10-02 NOTE — ED Notes (Signed)
Patient verbalizes understanding of discharge instructions . Opportunity for questions and answers were provided . Armband removed by staff ,Pt discharged from ED. W/C  offered at D/C  and Declined W/C at D/C and was escorted to lobby by RN.  

## 2018-10-02 NOTE — ED Notes (Signed)
Culture sent with UA 

## 2018-10-08 ENCOUNTER — Ambulatory Visit (INDEPENDENT_AMBULATORY_CARE_PROVIDER_SITE_OTHER): Payer: Medicaid Other | Admitting: Allergy

## 2018-10-08 ENCOUNTER — Encounter: Payer: Self-pay | Admitting: Allergy

## 2018-10-08 ENCOUNTER — Other Ambulatory Visit: Payer: Self-pay

## 2018-10-08 DIAGNOSIS — R0602 Shortness of breath: Secondary | ICD-10-CM | POA: Diagnosis not present

## 2018-10-08 DIAGNOSIS — T7819XA Other adverse food reactions, not elsewhere classified, initial encounter: Secondary | ICD-10-CM | POA: Insufficient documentation

## 2018-10-08 DIAGNOSIS — L509 Urticaria, unspecified: Secondary | ICD-10-CM

## 2018-10-08 DIAGNOSIS — T781XXA Other adverse food reactions, not elsewhere classified, initial encounter: Secondary | ICD-10-CM | POA: Insufficient documentation

## 2018-10-08 DIAGNOSIS — J3089 Other allergic rhinitis: Secondary | ICD-10-CM

## 2018-10-08 DIAGNOSIS — T781XXD Other adverse food reactions, not elsewhere classified, subsequent encounter: Secondary | ICD-10-CM

## 2018-10-08 MED ORDER — CETIRIZINE HCL 10 MG PO TABS: 10.0000 mg | ORAL_TABLET | Freq: Two times a day (BID) | ORAL | 5 refills | Status: AC

## 2018-10-08 MED ORDER — FAMOTIDINE 20 MG PO TABS: 20.0000 mg | ORAL_TABLET | Freq: Two times a day (BID) | ORAL | 5 refills | Status: DC

## 2018-10-08 MED ORDER — EPINEPHRINE 0.3 MG/0.3ML IJ SOAJ: 0.3000 mg | INTRAMUSCULAR | 2 refills | Status: DC | PRN

## 2018-10-08 NOTE — Assessment & Plan Note (Signed)
Shortness of breath episodes for the last 6 months. Mainly triggered by heat. Used to have albuterol inhaler during pregnancy but otherwise no formal diagnosis of asthma.  Today's spirometry was normal.   May use albuterol rescue inhaler 2 puffs or nebulizer every 4 to 6 hours as needed for shortness of breath, chest tightness, coughing, and wheezing. May use albuterol rescue inhaler 2 puffs 5 to 15 minutes prior to strenuous physical activities. Monitor frequency of use.

## 2018-10-08 NOTE — Patient Instructions (Addendum)
Today's skin testing showed: Positive to grass, tree, dust mites, cockroach Positive to shellfish.  Food allergy:  Continue to avoid peanuts, shellfish. Avoid milk, seafood, and egg for now.   I have prescribed epinephrine injectable and demonstrated proper use. For mild symptoms you can take over the counter antihistamines such as Benadryl and monitor symptoms closely. If symptoms worsen or if you have severe symptoms including breathing issues, throat closure, significant swelling, whole body hives, severe diarrhea and vomiting, lightheadedness then inject epinephrine and seek immediate medical care afterwards.  Food action plan given  Allergic rhinitis:  Environmental control measures  Hives:  Start zyrtec 8m twice a day.  Start pepcid 218mtwice a day.  Get bloodwork as below - CMP, ESR, CRP, Thyroid cascade, ANA w/reflex, C3, C4, alpha gal, nut panel, milk, egg, seafood.   Shortness of breath  Monitor symptoms.   May use albuterol rescue inhaler 2 puffs or nebulizer every 4 to 6 hours as needed for shortness of breath, chest tightness, coughing, and wheezing. May use albuterol rescue inhaler 2 puffs 5 to 15 minutes prior to strenuous physical activities. Monitor frequency of use.   Follow up in 1 month  Reducing Pollen Exposure . Pollen seasons: trees (spring), grass (summer) and ragweed/weeds (fall). . Marland Kitcheneep windows closed in your home and car to lower pollen exposure.  . Susa Simmondsir conditioning in the bedroom and throughout the house if possible.  . Avoid going out in dry windy days - especially early morning. . Pollen counts are highest between 5 - 10 AM and on dry, hot and windy days.  . Save outside activities for late afternoon or after a heavy rain, when pollen levels are lower.  . Avoid mowing of grass if you have grass pollen allergy. . Marland Kitchene aware that pollen can also be transported indoors on people and pets.  . Dry your clothes in an automatic dryer rather than  hanging them outside where they might collect pollen.  . Rinse hair and eyes before bedtime. Control of House Dust Mite Allergen . Dust mite allergens are a common trigger of allergy and asthma symptoms. While they can be found throughout the house, these microscopic creatures thrive in warm, humid environments such as bedding, upholstered furniture and carpeting. . Because so much time is spent in the bedroom, it is essential to reduce mite levels there.  . Encase pillows, mattresses, and box springs in special allergen-proof fabric covers or airtight, zippered plastic covers.  . Bedding should be washed weekly in hot water (130 F) and dried in a hot dryer. Allergen-proof covers are available for comforters and pillows that can't be regularly washed.  . Wendee Copphe allergy-proof covers every few months. Minimize clutter in the bedroom. Keep pets out of the bedroom.  . Marland Kitcheneep humidity less than 50% by using a dehumidifier or air conditioning. You can buy a humidity measuring device called a hygrometer to monitor this.  . If possible, replace carpets with hardwood, linoleum, or washable area rugs. If that's not possible, vacuum frequently with a vacuum that has a HEPA filter. . Remove all upholstered furniture and non-washable window drapes from the bedroom. . Remove all non-washable stuffed toys from the bedroom.  Wash stuffed toys weekly. Cockroach Allergen Avoidance Cockroaches are often found in the homes of densely populated urban areas, schools or commercial buildings, but these creatures can lurk almost anywhere. This does not mean that you have a dirty house or living area. . Block all areas where roaches  can enter the home. This includes crevices, wall cracks and windows.  . Cockroaches need water to survive, so fix and seal all leaky faucets and pipes. Have an exterminator go through the house when your family and pets are gone to eliminate any remaining roaches. Marland Kitchen Keep food in lidded containers  and put pet food dishes away after your pets are done eating. Vacuum and sweep the floor after meals, and take out garbage and recyclables. Use lidded garbage containers in the kitchen. Wash dishes immediately after use and clean under stoves, refrigerators or toasters where crumbs can accumulate. Wipe off the stove and other kitchen surfaces and cupboards regularly.   Skin care recommendations  Bath time: . Always use lukewarm water. AVOID very hot or cold water. Marland Kitchen Keep bathing time to 5-10 minutes. . Do NOT use bubble bath. . Use a mild soap and use just enough to wash the dirty areas. . Do NOT scrub skin vigorously.  . After bathing, pat dry your skin with a towel. Do NOT rub or scrub the skin.  Moisturizers and prescriptions:  . ALWAYS apply moisturizers immediately after bathing (within 3 minutes). This helps to lock-in moisture. . Use the moisturizer several times a day over the whole body. Kermit Balo summer moisturizers include: Aveeno, CeraVe, Cetaphil. Kermit Balo winter moisturizers include: Aquaphor, Vaseline, Cerave, Cetaphil, Eucerin, Vanicream. . When using moisturizers along with medications, the moisturizer should be applied about one hour after applying the medication to prevent diluting effect of the medication or moisturize around where you applied the medications. When not using medications, the moisturizer can be continued twice daily as maintenance.  Laundry and clothing: . Avoid laundry products with added color or perfumes. . Use unscented hypo-allergenic laundry products such as Tide free, Cheer free & gentle, and All free and clear.  . If the skin still seems dry or sensitive, you can try double-rinsing the clothes. . Avoid tight or scratchy clothing such as wool. . Do not use fabric softeners or dyer sheets.

## 2018-10-08 NOTE — Assessment & Plan Note (Addendum)
Daily hives for the past 4 months with no specific triggers noted. Eliminated egg and milk but still having hives. Tried benadryl prn with good benefit.   Today's skin testing was positive to grass, tree, dust mites, cockroach. These allergies may be contributing to her hives.   Get bloodwork as below to rule out any other etiologies.   Start zyrtec 10mg  twice a day.  Start Pepcid 20mg  twice a day.  Monitor symptoms.

## 2018-10-08 NOTE — Progress Notes (Signed)
New Patient Note  RE: [name] MRN: [id number] DOB: [birth year] Date of Office Visit: 10/08/2018  Referring provider: Chesley Noon, MD Primary care provider: Chesley Noon, MD  Chief Complaint: Allergic Reaction and Food Allergy  History of Present Illness: I had the pleasure of seeing [name] for initial evaluation at the [facility] [place] [place] on 10/08/2018. She is a 48 y.o. female, who is referred here by Chesley Noon, MD for the evaluation of food allergy.   Food allergy: She reports food allergy to peanuts. The reaction occurred in December 2019, after she was consuming peanuts more frequently. Symptoms started within minutes and was in the form of leg pruritus. Denies any swelling, wheezing, abdominal pain, diarrhea, vomiting. Denies any associated cofactors such as exertion, infection, NSAID use. The symptoms lasted for a few hours after benadryl.  She reports food allergy to shellfish. The reaction occurred over 20 years ago, after she ate a few shrimp and symptoms started within minutes. She had throat swelling and hives.    She was never evaluated in ED.  She does not have access to epinephrine autoinjector and not needed to use it.   Past work up includes: skin testing was over 20 years ago - records not available for review.  Dietary History: patient has been eating other foods including seafood, soy, wheat, meats, fruits and vegetables.  She reports reading labels and avoiding peanuts, tree nuts, shellfish, milk, eggs in diet completely.   Hives: Rash started about 4 months ago. Mainly occurs on her back but it can happen anywhere on her body. Describes them as red, raised, pruritic. Individual rashes lasts about few hours. No ecchymosis upon resolution. Associated symptoms include: none. Suspected triggers are eggs, milk but has episodes when not eating these foods. Denies any fevers, chills, changes in medications, foods, personal care  products or recent infections. She has tried the following therapies: benadryl prn with some benefit. Systemic steroids none. Currently on none.  Still having daily hives but not as bad as before.  Previous work up includes: none. Previous history of rash/hives: none. Patient is up to date with the following cancer screening tests: mammogram, pap smears, colonoscopy.  Reviewed images on the phone - consistent with urticarial lesions   SOB: She reports symptoms of shortness of breath for 6 months. Current medications include none. She tried the following inhalers: albuterol during pregnancy. Main triggers are heat. In the last month, frequency of symptoms: 1-2x/week. Symptoms usually lasts for 10-15 minutes.   Assessment and Plan: [name] is a 48 y.o. female with: Adverse food reaction Currently avoiding peanuts, shellfish, egg and milk. Reactions to peanuts in the form or pruritus and shellfish in the form of throat swelling and hives. Avoiding milk and egg now for possible hive triggers.  Today's skin testing was positive to shellfish only.   Continue to avoid peanuts, shellfish. Avoid milk, seafood, and egg for now.   Will get bloodwork and if negative will discuss milk and egg reintroduction first.   I have prescribed epinephrine injectable and demonstrated proper use. For mild symptoms you can take over the counter antihistamines such as Benadryl and monitor symptoms closely. If symptoms worsen or if you have severe symptoms including breathing issues, throat closure, significant swelling, whole body hives, severe diarrhea and vomiting, lightheadedness then inject epinephrine and seek immediate medical care afterwards.  Food action plan given  Urticaria Daily hives for the past 4 months with no specific triggers  noted. Eliminated egg and milk but still having hives. Tried benadryl prn with good benefit.   Today's skin testing was positive to grass, tree, dust mites, cockroach. These  allergies may be contributing to her hives.   Get bloodwork as below to rule out any other etiologies.   Start zyrtec 8m twice a day.  Start Pepcid 222mtwice a day.  Monitor symptoms.   Other allergic rhinitis Mild rhinitis symptoms.  Today's skin testing was positive to: grass, tree, dust mites, cockroach.  Discussed environmental control measures.  The antihistamines should also help with these symptoms.   Shortness of breath Shortness of breath episodes for the last 6 months. Mainly triggered by heat. Used to have albuterol inhaler during pregnancy but otherwise no formal diagnosis of asthma.  Today's spirometry was normal.   May use albuterol rescue inhaler 2 puffs or nebulizer every 4 to 6 hours as needed for shortness of breath, chest tightness, coughing, and wheezing. May use albuterol rescue inhaler 2 puffs 5 to 15 minutes prior to strenuous physical activities. Monitor frequency of use.   Return in about 4 weeks (around 11/05/2018).  Meds ordered this encounter  Medications   famotidine (PEPCID) 20 MG tablet    Sig: Take 1 tablet (20 mg total) by mouth 2 (two) times daily.    Dispense:  60 tablet    Refill:  5   EPINEPHrine (EPIPEN 2-PAK) 0.3 mg/0.3 mL IJ SOAJ injection    Sig: Inject 0.3 mLs (0.3 mg total) into the muscle as needed for anaphylaxis.    Dispense:  1 Device    Refill:  2   cetirizine (ZYRTEC ALLERGY) 10 MG tablet    Sig: Take 1 tablet (10 mg total) by mouth 2 (two) times daily.    Dispense:  60 tablet    Refill:  5    Lab Orders     Comprehensive metabolic panel     Sed Rate (ESR)     C-reactive protein     Thyroid Cascade Profile     ANA Direct w/Reflex if Positive     C3 and C4     Alpha-Gal Panel     Allergy Panel 18, Nut Mix Group     Milk IgE     Egg Component Panel     Allergy Panel 19, Seafood Group  Other allergy screening: Asthma: no Rhino conjunctivitis: yes  Mild rhinitis symptoms.   Food allergy: yes Medication  allergy: no Hymenoptera allergy: no Urticaria: yes Eczema:no History of recurrent infections suggestive of immunodeficency: no  Diagnostics: Spirometry:  Tracings reviewed. Her effort: Good reproducible efforts. FVC: 2.60L FEV1: 2.50L, 108% predicted FEV1/FVC ratio: 96% Interpretation: Spirometry consistent with normal pattern.  Please see scanned spirometry results for details.  Skin Testing: Environmental allergy panel and select foods. Positive test to: grass, tree, dust mites, cockroach, shellfish. Results discussed with patient/family. Airborne Adult Perc - 10/08/18 1013    Time Antigen Placed  1015    Allergen Manufacturer  GrLavella Hammock  Location  Back    Number of Test  59    Panel 1  Select    1. Control-Buffer 50% Glycerol  Negative    2. Control-Histamine 1 mg/ml  3+    3. Albumin saline  Negative    4. BaAthena4+    5. BeGuatemala4+    6. Johnson  4+    7. Kentucky Blue  2+    8. Meadow Fescue  3+  9. Perennial Rye  3+    10. Sweet Vernal  3+    11. Timothy  4+    12. Cocklebur  Negative    13. Burweed Marshelder  Negative    14. Ragweed, short  Negative    15. Ragweed, Giant  Negative    16. Plantain,  English  Negative    17. Lamb's Quarters  Negative    18. Sheep Sorrell  Negative    19. Rough Pigweed  Negative    20. Marsh Elder, Rough  Negative    21. Mugwort, Common  Negative    22. Ash mix  Negative    23. Birch mix  4+    24. Beech American  4+    25. Box, Elder  Negative    26. Cedar, red  Negative    27. Cottonwood, [place]  Negative    28. Elm mix  Negative    29. Hickory mix  Negative    30. Maple mix  2+    31. Oak, [place] mix  4+    32. Pecan Pollen  3+    33. Pine mix  Negative    34. Sycamore Eastern  Negative    35. Meadowood, Black Pollen  Negative    36. Alternaria alternata  Negative    37. Cladosporium Herbarum  Negative    38. Aspergillus mix  Negative    39. Penicillium mix  Negative    40. Bipolaris sorokiniana  (Helminthosporium)  Negative    41. Drechslera spicifera (Curvularia)  Negative    42. Mucor plumbeus  Negative    43. Fusarium moniliforme  Negative    44. Aureobasidium pullulans (pullulara)  Negative    45. Rhizopus oryzae  Negative    46. Botrytis cinera  Negative    47. Epicoccum nigrum  Negative    48. Phoma betae  Negative    49. Candida Albicans  Negative    50. Trichophyton mentagrophytes  Negative    51. Mite, D Farinae  5,000 AU/ml  2+    52. Mite, D Pteronyssinus  5,000 AU/ml  2+    53. Cat Hair 10,000 BAU/ml  Negative    54.  Dog Epithelia  Negative    55. Mixed Feathers  Negative    56. Horse Epithelia  Negative    57. Cockroach, German  3+    58. Mouse  Negative    59. Tobacco Leaf  Negative     Food Perc - 10/08/18 1014    Time Antigen Placed  1015    Allergen Manufacturer  Lavella Hammock    Location  Back    Number of allergen test  10    Food  Select    1. Peanut  Omitted    2. Soybean food  Omitted    3. Wheat, whole  Omitted    4. Sesame  Omitted    5. Milk, cow  Omitted    6. Egg White, chicken  Omitted    7. Casein  Omitted    8. Shellfish mix  Omitted    9. Fish mix  Omitted    10. [place] Adult Perc - 10/08/18 1000    Time Antigen Placed  1015    Allergen Manufacturer  Lavella Hammock    Location  Back    Number of allergen test  13    Panel 2  Select    Control-Histamine 1 mg/ml  3+  1. Peanut  Negative    2. Soybean  Negative    3. Wheat  Negative    4. Sesame  Negative    5. Milk, cow  Negative    6. Egg White, Chicken  Negative    7. Casein  Negative    8. Shellfish Mix  --   5x3   9. Fish Mix  Negative    10. Cashew  Negative    18. Catfish  Negative    19. Bass  Negative    20. Trout  Negative    21. Tuna  Negative    22. Salmon  Negative    23. Flounder  Negative    24. Codfish  Negative    25. Shrimp  --   3x3   26. Crab  --   13x4   27. Lobster  --   6x5   28. Oyster  --   11x5   29. Scallops  Negative        Past Medical History: Patient Active Problem List   Diagnosis Date Noted   Urticaria 10/08/2018   Other allergic rhinitis 10/08/2018   Adverse food reaction 10/08/2018   Shortness of breath 10/08/2018   Pelvic pain 12/04/2011   Past Medical History:  Diagnosis Date   Anemia    History of chicken pox    History of rubella    Increased abdominal girth    Menorrhagia    Past Surgical History: Past Surgical History:  Procedure Laterality Date   ABDOMINAL HYSTERECTOMY     CERVIAL CYST     CHOLECYSTECTOMY     CHROMOPERTUBATION Bilateral 01/22/2013   Procedure: CHROMOPERTUBATION;  Surgeon: Delice Lesch, MD;  Location: [facility];  Service: Gynecology;  Laterality: Bilateral;   LAPAROSCOPY N/A 01/22/2013   Procedure: LAPAROSCOPY OPERATIVE;  Surgeon: Delice Lesch, MD;  Location: [facility];  Service: Gynecology;  Laterality: N/A;   TUBAL LIGATION     Medication List:  Current Outpatient Medications  Medication Sig Dispense Refill   cetirizine (ZYRTEC ALLERGY) 10 MG tablet Take 1 tablet (10 mg total) by mouth 2 (two) times daily. 60 tablet 5   dicyclomine (BENTYL) 20 MG tablet Take 1 tablet (20 mg total) by mouth 3 (three) times daily as needed for spasms (abdominal cramping). (Patient not taking: Reported on 10/02/2018) 20 tablet 0   EPINEPHrine (EPIPEN 2-PAK) 0.3 mg/0.3 mL IJ SOAJ injection Inject 0.3 mLs (0.3 mg total) into the muscle as needed for anaphylaxis. 1 Device 2   famotidine (PEPCID) 20 MG tablet Take 1 tablet (20 mg total) by mouth 2 (two) times daily. 60 tablet 5   fluconazole (DIFLUCAN) 200 MG tablet Take 1 tablet (200 mg total) by mouth once as needed for up to 1 dose (if yeast infection symptoms develop. Repeat dose in 48 hours if symptoms continue.). (Patient not taking: Reported on 10/02/2018) 1 tablet 1   pantoprazole (PROTONIX) 40 MG tablet Take 1 tablet (40 mg total) by mouth daily. (Patient not taking: Reported on 10/02/2018) 30 tablet 0   No  current facility-administered medications for this visit.    Allergies: Allergies  Allergen Reactions   Shellfish Allergy Itching    Itching in throat   Social History: Social History   Socioeconomic History   Marital status: Single    Spouse name: Not on file   Number of children: Not on file   Years of education: Not on file   Highest education level: Not on file  Occupational  History   Not on file  Social Needs   Financial resource strain: Not on file   Food insecurity:    Worry: Not on file    Inability: Not on file   Transportation needs:    Medical: Not on file    Non-medical: Not on file  Tobacco Use   Smoking status: Never Smoker   Smokeless tobacco: Never Used  Substance and Sexual Activity   Alcohol use: No   Drug use: No   Sexual activity: Not Currently    Birth control/protection: None  Lifestyle   Physical activity:    Days per week: Not on file    Minutes per session: Not on file   Stress: Not on file  Relationships   Social connections:    Talks on phone: Not on file    Gets together: Not on file    Attends religious service: Not on file    Active member of club or organization: Not on file    Attends meetings of clubs or organizations: Not on file    Relationship status: Not on file  Other Topics Concern   Not on file  Social History Narrative   Not on file   Lives in a apartment. Smoking: denies Occupation: unemployed  Environmental HistoryFreight forwarder in the house: no Charity fundraiser in the family room: yes Carpet in the bedroom: yes Heating: electric Cooling: central Pet: no  Family History: History reviewed. No pertinent family history. Problem                               Relation Asthma                                   No  Eczema                                No  Food allergy                          Food allergy Allergic rhino conjunctivitis     No  Urticaria    No  Review of Systems    Constitutional: Negative for appetite change, chills, fever and unexpected weight change.  HENT: Negative for congestion and rhinorrhea.   Eyes: Negative for itching.  Respiratory: Positive for shortness of breath. Negative for cough, chest tightness and wheezing.   Cardiovascular: Negative for chest pain.  Gastrointestinal: Negative for abdominal pain.  Genitourinary: Negative for difficulty urinating.  Skin: Positive for rash.  Allergic/Immunologic: Positive for environmental allergies and food allergies.  Neurological: Negative for headaches.   Objective: LMP 10/15/2014 Comment: scheduled for hysterectomy in 1 month There is no height or weight on file to calculate BMI. Physical Exam  Constitutional: She is oriented to person, place, and time. She appears well-developed and well-nourished.  HENT:  Head: Normocephalic and atraumatic.  Right Ear: External ear normal.  Left Ear: External ear normal.  Nose: Nose normal.  Mouth/Throat: Oropharynx is clear and moist.  Eyes: Conjunctivae and EOM are normal.  Neck: Neck supple.  Cardiovascular: Normal rate, regular rhythm and normal heart sounds. Exam reveals no gallop and no friction rub.  No murmur heard. Pulmonary/Chest: Effort normal and breath sounds normal. She has no wheezes. She has no rales.  Abdominal: Soft.  Neurological: She is alert and oriented to person, place, and time.  Skin: Skin is warm. No rash noted.  Psychiatric: She has a normal mood and affect. Her behavior is normal.  Nursing note and vitals reviewed.  The plan was reviewed with the patient/family, and all questions/concerned were addressed.  It was my pleasure to see Yocheved today and participate in her care. Please feel free to contact me with any questions or concerns.  Sincerely,  Rexene Alberts, DO Allergy & Immunology  [facility] office: [phone] [facility] office: [phone]

## 2018-10-08 NOTE — Assessment & Plan Note (Addendum)
Currently avoiding peanuts, shellfish, egg and milk. Reactions to peanuts in the form or pruritus and shellfish in the form of throat swelling and hives. Avoiding milk and egg now for possible hive triggers.  Today's skin testing was positive to shellfish only.   Continue to avoid peanuts, shellfish. Avoid milk, seafood, and egg for now.   Will get bloodwork and if negative will discuss milk and egg reintroduction first.   I have prescribed epinephrine injectable and demonstrated proper use. For mild symptoms you can take over the counter antihistamines such as Benadryl and monitor symptoms closely. If symptoms worsen or if you have severe symptoms including breathing issues, throat closure, significant swelling, whole body hives, severe diarrhea and vomiting, lightheadedness then inject epinephrine and seek immediate medical care afterwards.  Food action plan given

## 2018-10-08 NOTE — Assessment & Plan Note (Signed)
Mild rhinitis symptoms.  Today's skin testing was positive to: grass, tree, dust mites, cockroach.  Discussed environmental control measures.  The antihistamines should also help with these symptoms.

## 2018-10-10 ENCOUNTER — Telehealth: Payer: Self-pay

## 2018-10-10 LAB — COMPREHENSIVE METABOLIC PANEL
ALT: 19 IU/L (ref 0–32)
AST: 17 IU/L (ref 0–40)
Albumin/Globulin Ratio: 1.3 (ref 1.2–2.2)
Albumin: 4.4 g/dL (ref 3.8–4.8)
Alkaline Phosphatase: 73 IU/L (ref 39–117)
BUN/Creatinine Ratio: 9 (ref 9–23)
BUN: 6 mg/dL (ref 6–24)
Bilirubin Total: 0.5 mg/dL (ref 0.0–1.2)
CO2: 26 mmol/L (ref 20–29)
Calcium: 9.5 mg/dL (ref 8.7–10.2)
Chloride: 103 mmol/L (ref 96–106)
Creatinine, Ser: 0.68 mg/dL (ref 0.57–1.00)
GFR calc Af Amer: 121 mL/min/{1.73_m2} (ref >59)
GFR calc non Af Amer: 105 mL/min/{1.73_m2} (ref >59)
Globulin, Total: 3.3 g/dL (ref 1.5–4.5)
Glucose: 97 mg/dL (ref 65–99)
Potassium: 4.4 mmol/L (ref 3.5–5.2)
Sodium: 141 mmol/L (ref 134–144)
Total Protein: 7.7 g/dL (ref 6.0–8.5)

## 2018-10-10 LAB — ALLERGY PANEL 19, SEAFOOD GROUP
Allergen Salmon IgE: <0.1 kU/L
Catfish: <0.1 kU/L
Codfish IgE: 0.12 kU/L — AB
F023-IgE Crab: 88.3 kU/L — AB
F080-IgE Lobster: 79.2 kU/L — AB
Shrimp IgE: 94.3 kU/L — AB
Tuna: 1.02 kU/L — AB

## 2018-10-10 LAB — EGG COMPONENT PANEL
F232-IgE Ovalbumin: <0.1 kU/L
F233-IgE Ovomucoid: <0.1 kU/L

## 2018-10-10 LAB — ANA W/REFLEX IF POSITIVE: Anti Nuclear Antibody (ANA): NEGATIVE

## 2018-10-10 LAB — THYROID CASCADE PROFILE: TSH: 1.16 u[IU]/mL (ref 0.450–4.500)

## 2018-10-10 LAB — ALLERGY PANEL 18, NUT MIX GROUP
Allergen Coconut IgE: <0.1 kU/L
F020-IgE Almond: <0.1 kU/L
F202-IgE Cashew Nut: <0.1 kU/L
Hazelnut (Filbert) IgE: 2.63 kU/L — AB
Peanut IgE: <0.1 kU/L
Pecan Nut IgE: <0.1 kU/L
Sesame Seed IgE: <0.1 kU/L

## 2018-10-10 LAB — C-REACTIVE PROTEIN: CRP: 7 mg/L (ref 0–10)

## 2018-10-10 LAB — C3 AND C4
Complement C3, Serum: 162 mg/dL (ref 82–167)
Complement C4, Serum: 27 mg/dL (ref 14–44)

## 2018-10-10 LAB — SEDIMENTATION RATE: Sed Rate: 47 mm/hr — ABNORMAL HIGH (ref 0–32)

## 2018-10-10 NOTE — Telephone Encounter (Signed)
Prior authorization requested for EpiPen. This has been approved and sent to the pharmacy.

## 2018-10-21 NOTE — Addendum Note (Signed)
Addended by: Lucrezia Starch I on: 10/21/2018 11:57 AM   Modules accepted: Orders

## 2018-10-26 LAB — ALPHA-GAL PANEL
Alpha Gal IgE*: <0.1 kU/L (ref <0.10)
Beef (Bos spp) IgE: <0.1 kU/L (ref <0.35)
Class Interpretation: 0
Class Interpretation: 0
Class Interpretation: 0
Lamb/Mutton (Ovis spp) IgE: <0.1 kU/L (ref <0.35)
Pork (Sus spp) IgE: <0.1 kU/L (ref <0.35)

## 2018-10-26 LAB — ALLERGEN MILK: Milk IgE: <0.1 kU/L

## 2018-10-28 ENCOUNTER — Telehealth: Payer: Self-pay | Admitting: *Deleted

## 2018-10-28 NOTE — Telephone Encounter (Signed)
Patient would like more explanation on the SED rate and the inflammation. I told patient that it is not something that needs any further investigation but patient seemed concern. Patient would also like you to review her [facility] chart to make sure nothing is being missed as she has been having other issues with the swelling and allergies. Please call patient. Patient informed Dr. Maudie Mercury is out of the office until tomorrow.

## 2018-10-29 NOTE — Telephone Encounter (Signed)
Tried to call but no answer. Left VM to call us back.

## 2018-11-05 NOTE — Telephone Encounter (Signed)
Spoke with patient on the phone. Will recheck SED rate in a few months.  Scheduled for food challenge in July. Bring in milk or scrambled eggs.  Continue to avoid peanuts, hazelnuts, shellfish. Avoid milk, seafood, and egg for now.  Please schedule for milk or egg in office food challenge. Must be off antihistamines for the food challenge and bring in the food with them. Plan on being here for 2-3 hours.

## 2018-11-12 ENCOUNTER — Ambulatory Visit: Payer: Medicaid Other | Admitting: Allergy

## 2018-11-22 IMAGING — US US ART/VEN ABD/PELV/SCROTUM DOPPLER LTD
1 series · 13 of 25 positions shown · non-contrast
Comparison: 03/11/2017

CLINICAL DATA: 46-year-old female with pelvic pain for 1 month.
History of hysterectomy.

EXAM:
TRANSABDOMINAL AND TRANSVAGINAL ULTRASOUND OF PELVIS
DOPPLER ULTRASOUND OF OVARIES
TECHNIQUE: Both transabdominal and transvaginal ultrasound examinations of the
pelvis were performed. Transabdominal technique was performed for
global imaging of the pelvis including ovaries, adnexal regions, and
pelvic cul-de-sac.
It was necessary to proceed with endovaginal exam following the
transabdominal exam to visualize the adnexal regions. Color and
duplex Doppler ultrasound was utilized to evaluate blood flow to the
ovaries.

[Series 1: us art/ven abd/pelv/scrotum doppler ltd · 0.24mm/px · 61 acquisitions, 13 frames shown]
[im 1/61]
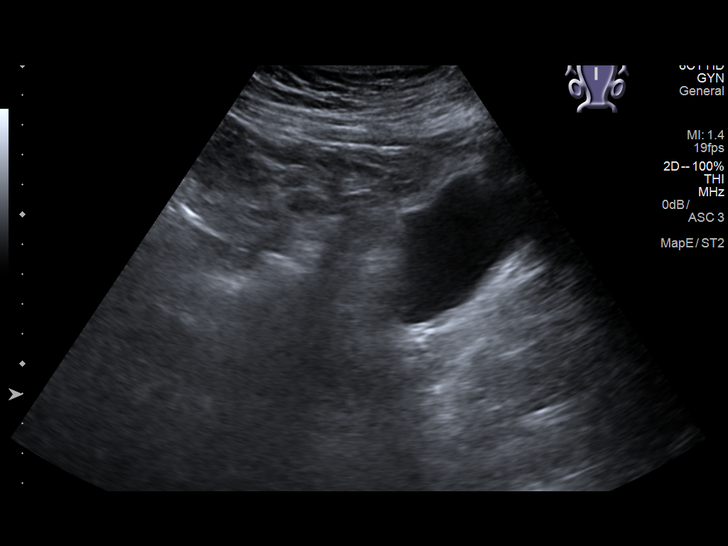
[im 6/61]
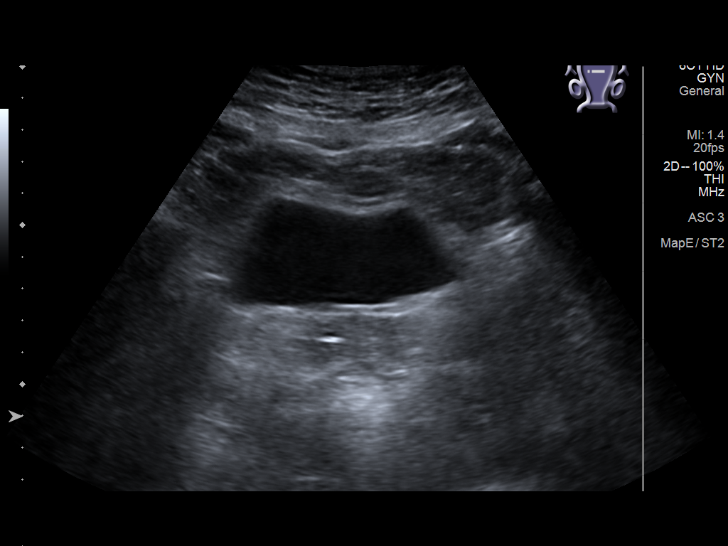
[im 11/61]
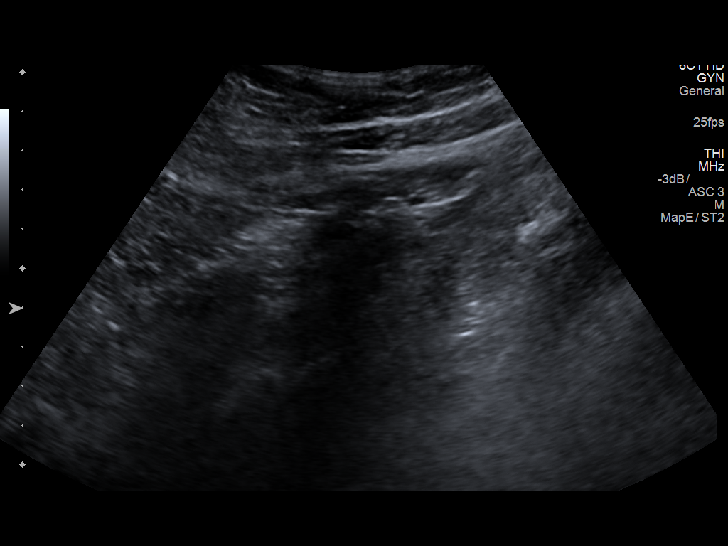
[im 16/61]
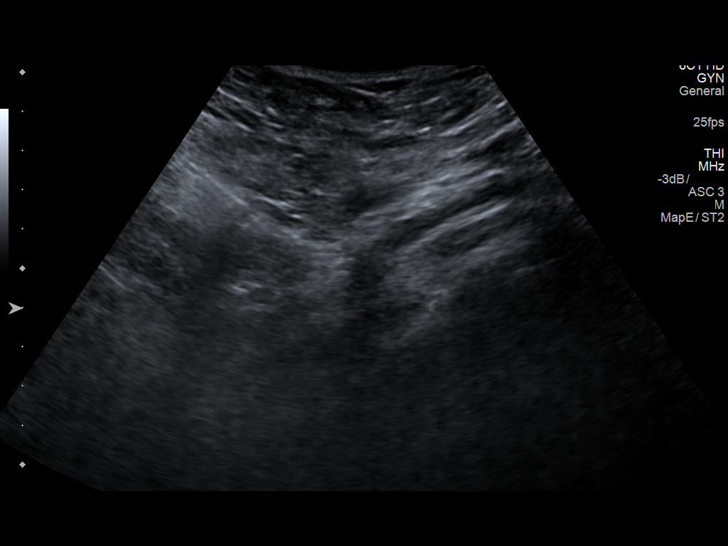
[im 21/61]
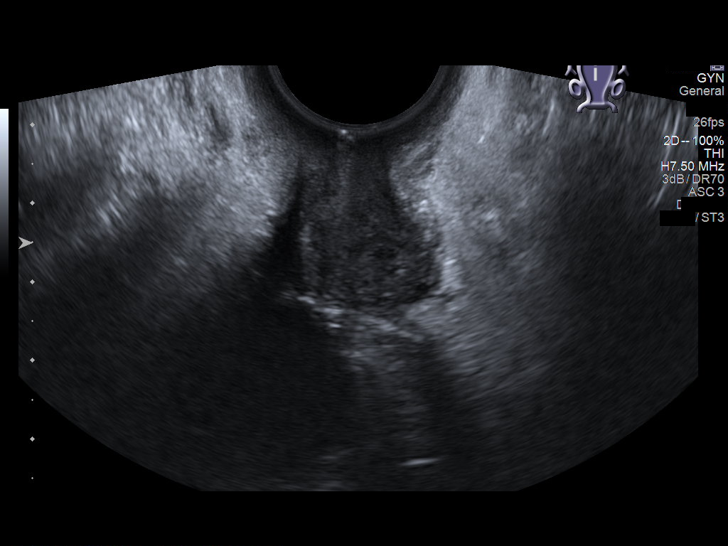
[im 26/61]
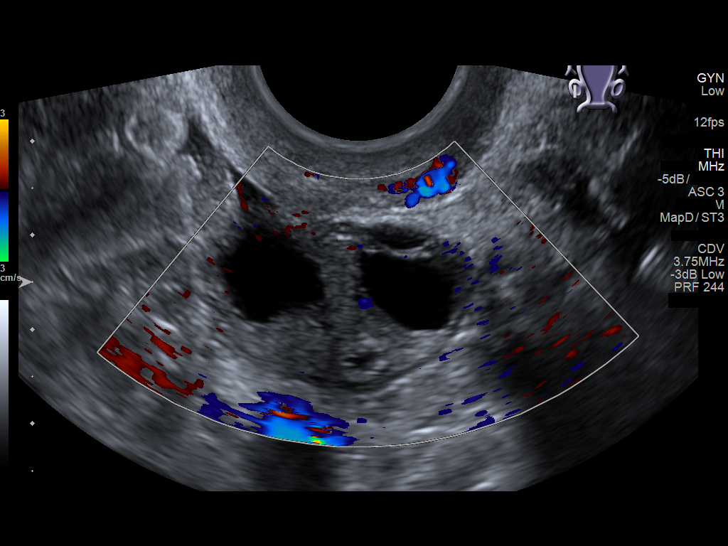
[im 31/61]
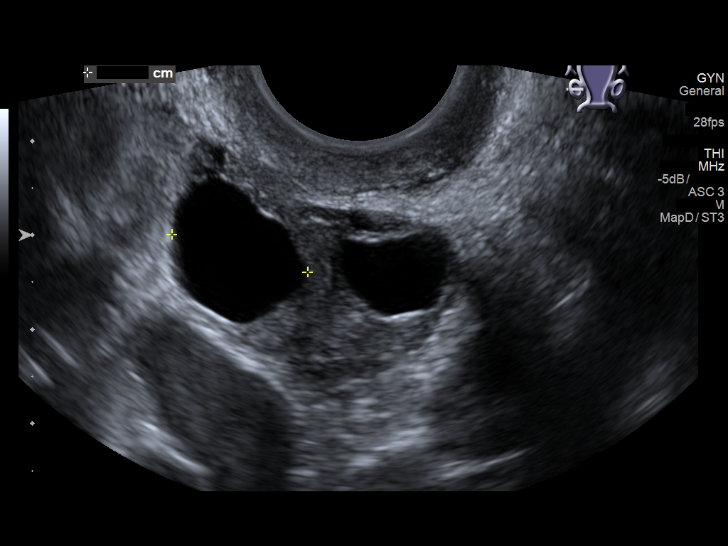
[im 36/61]
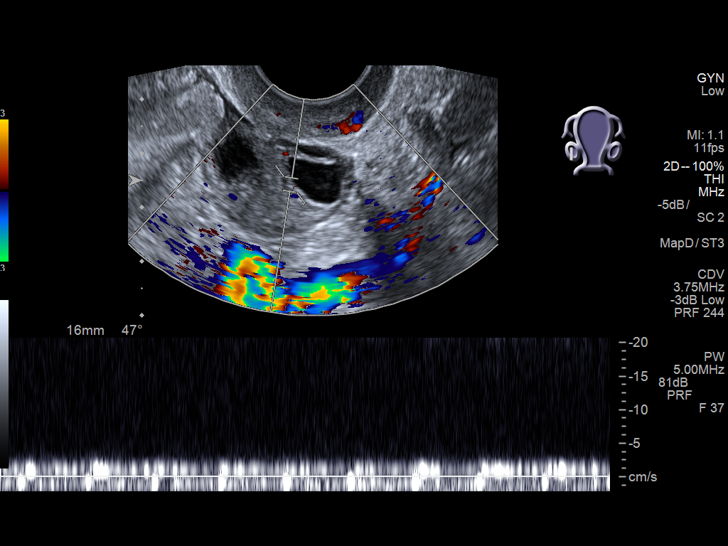
[im 41/61]
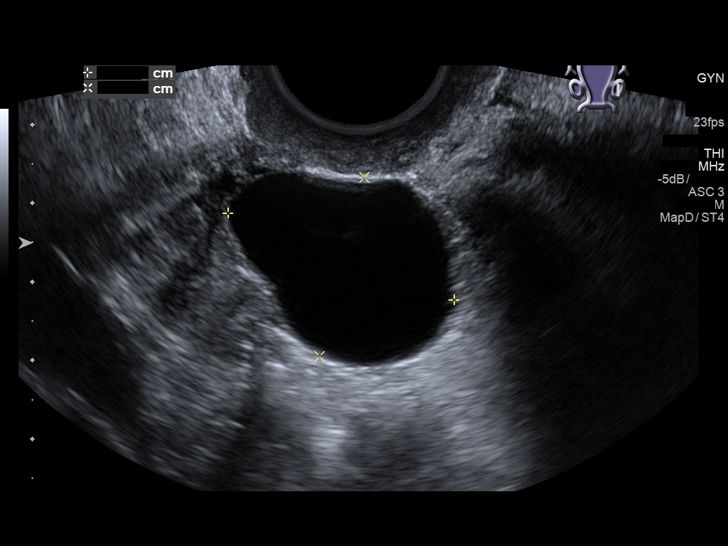
[im 46/61]
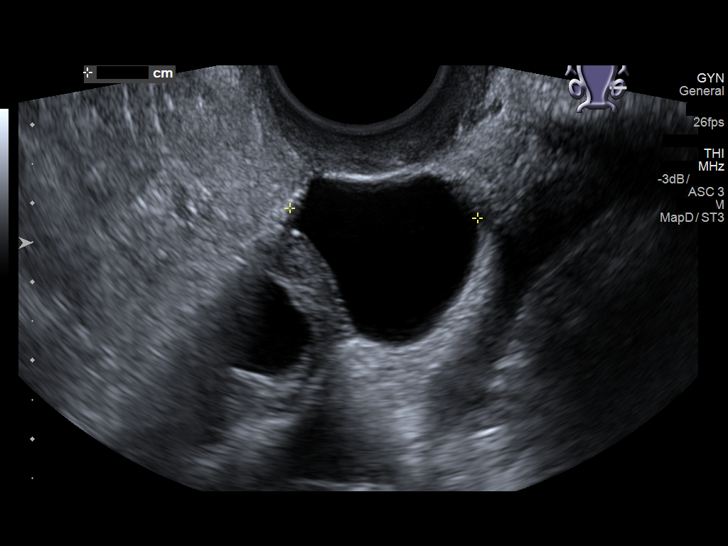
[im 51/61]
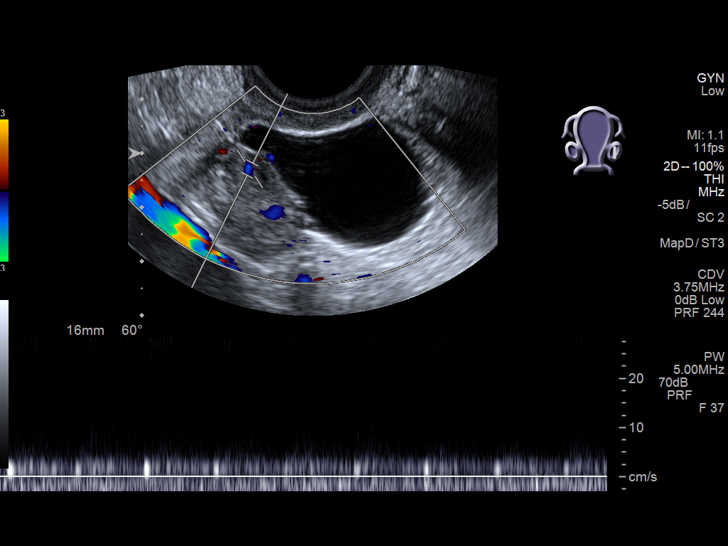
[im 56/61]
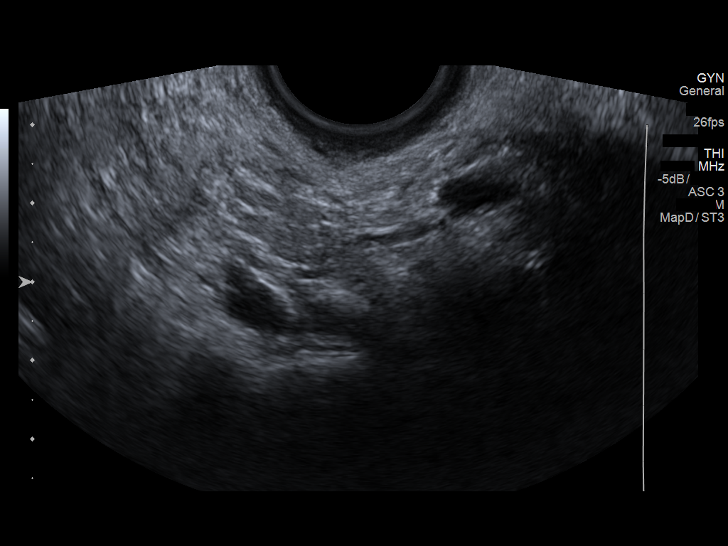
[im 61/61]
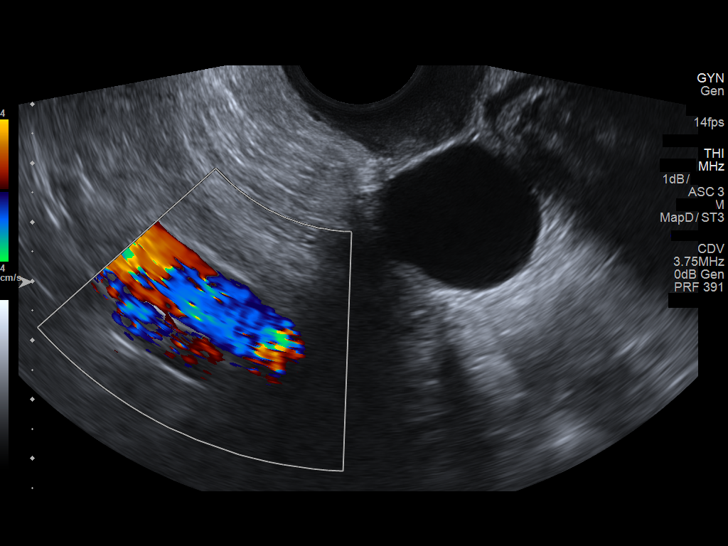

[13 of 25 positions shown; findings below may reference images not displayed]

FINDINGS: Uterus

Not visualized compatible with hysterectomy.

Right ovary

Measurements: 3.1 x 1.8 x 3.2 cm.. Normal appearance/no adnexal
mass.

Left ovary

Measurements: 4.2 x 2.7 x 3.2 cm.. A 3.1 x 2.4 x 2.4 cm simple cyst
is noted. No solid mass.

Pulsed Doppler evaluation of both ovaries demonstrates normal
low-resistance arterial and venous waveforms.

Other findings

Trace amount of free pelvic fluid noted.
IMPRESSION: 1. 3.1 cm simple left ovarian cyst.
2. Normal right ovary.  No evidence of ovarian torsion.
3. Trace free fluid which may be physiologic
4. Status posthysterectomy.

## 2018-12-18 ENCOUNTER — Encounter: Payer: Self-pay | Admitting: Allergy

## 2018-12-18 ENCOUNTER — Other Ambulatory Visit: Payer: Self-pay

## 2018-12-18 ENCOUNTER — Ambulatory Visit (INDEPENDENT_AMBULATORY_CARE_PROVIDER_SITE_OTHER): Payer: Medicaid Other | Admitting: Allergy

## 2018-12-18 VITALS — BP 108/80 | HR 97 | Temp 98.1°F | Resp 16

## 2018-12-18 DIAGNOSIS — T781XXD Other adverse food reactions, not elsewhere classified, subsequent encounter: Secondary | ICD-10-CM

## 2018-12-18 DIAGNOSIS — L509 Urticaria, unspecified: Secondary | ICD-10-CM

## 2018-12-18 DIAGNOSIS — R0602 Shortness of breath: Secondary | ICD-10-CM

## 2018-12-18 DIAGNOSIS — J302 Other seasonal allergic rhinitis: Secondary | ICD-10-CM | POA: Insufficient documentation

## 2018-12-18 DIAGNOSIS — J3089 Other allergic rhinitis: Secondary | ICD-10-CM

## 2018-12-18 NOTE — Patient Instructions (Addendum)
Adverse food reaction  Failed egg challenge.  Continue to avoid peanuts, shellfish, seafood, and egg.  For mild symptoms you can take over the counter antihistamines such as Benadryl and monitor symptoms closely. If symptoms worsen or if you have severe symptoms including breathing issues, throat closure, significant swelling, whole body hives, severe diarrhea and vomiting, lightheadedness then inject epinephrine and seek immediate medical care afterwards.  Food action plan given  Urticaria  May take zyrtec 10mg  twice a day as needed for hives.   Monitor symptoms.   Other allergic rhinitis  Past skin testing was positive to: grass, tree, dust mites, cockroach.  Continue environmental control measures.  The antihistamines should also help with these symptoms.   Follow up in  6 months

## 2018-12-18 NOTE — Assessment & Plan Note (Addendum)
Past history - Currently avoiding peanuts, shellfish, egg and milk. Reactions to peanuts in the form or pruritus and shellfish in the form of throat swelling and hives. Avoiding milk and egg now for possible hive triggers. 2020 skin testing was positive to shellfish only. 2020 immunocap positive to shellfish, seafood, hazelnut; negative to milk and egg components.  Interim history - broke out in hives after contact with shrimp on her forearm.   Failed egg challenge.  Patient complained of slight arm pruritus after first dose of 1.25g.  Patient complained of tongue pruritus after second dose of 5g which worsened. No other symptoms. Vitals stable.  Treated with benadryl 50mg  and prednisolone 45mg . Symptoms resolved after 30 minutes and discharged.  Continue to avoid peanuts, shellfish, seafood and egg.   For mild symptoms you can take over the counter antihistamines such as Benadryl and monitor symptoms closely. If symptoms worsen or if you have severe symptoms including breathing issues, throat closure, significant swelling, whole body hives, severe diarrhea and vomiting, lightheadedness then inject epinephrine and seek immediate medical care afterwards.  Food action plan given.

## 2018-12-18 NOTE — Assessment & Plan Note (Signed)
Past history - Daily hives for the past 4 months with no specific triggers noted. Eliminated egg and milk but still having hives. Tried benadryl prn with good benefit.  Interim history - hives resolved and only taking zyrtec as needed.   Monitor symptoms.   May take zyrtec 10mg  twice a day as needed for hives.

## 2018-12-18 NOTE — Assessment & Plan Note (Signed)
Past history - Shortness of breath episodes for the last 6 months. Mainly triggered by heat. Used to have albuterol inhaler during pregnancy but otherwise no formal diagnosis of asthma. 2020 spirometry was normal.  Interim history - no symptoms and did not pick up albuterol.  Monitor symptoms. If having issues will resend Rx for albuterol.

## 2018-12-18 NOTE — Assessment & Plan Note (Signed)
Past history - Mild rhinitis symptoms. 2020 skin testing was positive to: grass, tree, dust mites, cockroach. Interim history - asymptomatic.   Continue environmental control measures.  The antihistamines should also help with these symptoms.

## 2018-12-18 NOTE — Progress Notes (Signed)
Follow Up Note  RE: [name] MRN: [id number] DOB: 08-21-1970 Date of Office Visit: 12/18/2018  Referring provider: Chesley Noon, MD Primary care provider: Chesley Noon, MD  Chief Complaint:Food/Drug Challenge (Egg)   Assessment and Plan: [name] is a 48 y.o. female with: Adverse food reaction Past history - Currently avoiding peanuts, shellfish, egg and milk. Reactions to peanuts in the form or pruritus and shellfish in the form of throat swelling and hives. Avoiding milk and egg now for possible hive triggers. 2020 skin testing was positive to shellfish only. 2020 immunocap positive to shellfish, seafood, hazelnut; negative to milk and egg components.  Interim history - broke out in hives after contact with shrimp on her forearm.   Failed egg challenge.  Patient complained of slight arm pruritus after first dose of 1.25g.  Patient complained of tongue pruritus after second dose of 5g which worsened. No other symptoms. Vitals stable.  Treated with benadryl 50mg  and prednisolone 45mg . Symptoms resolved after 30 minutes and discharged.  Continue to avoid peanuts, shellfish, seafood and egg.   For mild symptoms you can take over the counter antihistamines such as Benadryl and monitor symptoms closely. If symptoms worsen or if you have severe symptoms including breathing issues, throat closure, significant swelling, whole body hives, severe diarrhea and vomiting, lightheadedness then inject epinephrine and seek immediate medical care afterwards.  Food action plan given.  Urticaria Past history - Daily hives for the past 4 months with no specific triggers noted. Eliminated egg and milk but still having hives. Tried benadryl prn with good benefit.  Interim history - hives resolved and only taking zyrtec as needed.   Monitor symptoms.   May take zyrtec 10mg  twice a day as needed for hives.   Seasonal and perennial allergic rhinitis Past history - Mild rhinitis  symptoms. 2020 skin testing was positive to: grass, tree, dust mites, cockroach. Interim history - asymptomatic.   Continue environmental control measures.  The antihistamines should also help with these symptoms.   Shortness of breath Past history - Shortness of breath episodes for the last 6 months. Mainly triggered by heat. Used to have albuterol inhaler during pregnancy but otherwise no formal diagnosis of asthma. 2020 spirometry was normal.  Interim history - no symptoms and did not pick up albuterol.  Monitor symptoms. If having issues will resend Rx for albuterol.  Return in about 4 months (around 04/20/2019).  Plan: Challenge food: scrambled eggs Challenge as per protocol: Failed Total time: 75 min  History of Present Illness: I had the pleasure of seeing [name] for a follow up visit at the [facility] on 12/18/2018. She is a 48 y.o. female, who is being followed for food allergy, hives, allergic rhinitis, SOB. Today she is here for egg food challenge. Her previous allergy office visit was on 10/08/2018.   Chief Complaint: Challenge testing to eggs.  History of Reaction: Currently avoiding shellfish and nuts. Eating ice cream with no issues. Avoiding eggs as she thinks it was triggering her hives.   Urticaria Broke out in a small rash after she touched something with shellfish.  Shortness of breath Asymptomatic and did not get albuterol as pharmacy told her it was not sent in.    Labs: 10/08/2018 F232-IgE Ovalbumin Class 0 kU/L <0.10  F233-IgE Ovomucoid Class 0 kU/L <0.10   10/08/2018 skin testing negative to egg.  Interval History: Patient has not been ill, she has not had any accidental exposures to  the culprit medication.   Recent/Current History: Pulmonary disease: no Cardiac disease: no Respiratory infection: no Rash: no Itch: no Swelling: no Cough: no Shortness of breath: no Runny/stuffy nose: no Itchy eyes: no Beta-blocker  use: no  Patient/guardian was informed of the test procedure with verbalized understanding of the risk of anaphylaxis.   Last antihistamine use: 3+ days ago Last beta-blocker use: n/a  Medication List:  Current Outpatient Medications  Medication Sig Dispense Refill  . cetirizine (ZYRTEC ALLERGY) 10 MG tablet Take 1 tablet (10 mg total) by mouth 2 (two) times daily. 60 tablet 5  . dicyclomine (BENTYL) 20 MG tablet Take 1 tablet (20 mg total) by mouth 3 (three) times daily as needed for spasms (abdominal cramping). (Patient not taking: Reported on 10/02/2018) 20 tablet 0  . EPINEPHrine (EPIPEN 2-PAK) 0.3 mg/0.3 mL IJ SOAJ injection Inject 0.3 mLs (0.3 mg total) into the muscle as needed for anaphylaxis. 1 Device 2  . fluconazole (DIFLUCAN) 200 MG tablet Take 1 tablet (200 mg total) by mouth once as needed for up to 1 dose (if yeast infection symptoms develop. Repeat dose in 48 hours if symptoms continue.). (Patient not taking: Reported on 10/02/2018) 1 tablet 1  . pantoprazole (PROTONIX) 40 MG tablet Take 1 tablet (40 mg total) by mouth daily. (Patient not taking: Reported on 10/02/2018) 30 tablet 0   No current facility-administered medications for this visit.     Allergies: Allergies  Allergen Reactions  . Shellfish Allergy Itching    Itching in throat    I reviewed her past medical history, social history, family history, and environmental history and no significant changes have been reported from previous visit on 10/08/2018 - had hysterectomy in May.   Review of Systems  Constitutional: Negative for appetite change, chills, fever and unexpected weight change.  HENT: Negative for congestion and rhinorrhea.   Eyes: Negative for itching.  Respiratory: Negative for cough, chest tightness, shortness of breath and wheezing.   Cardiovascular: Negative for chest pain.  Gastrointestinal: Negative for abdominal pain.  Genitourinary: Negative for difficulty urinating.  Skin: Positive for  rash.  Allergic/Immunologic: Positive for environmental allergies and food allergies.  Neurological: Negative for headaches.    Objective: BP 108/80   Pulse 97   Temp 98.1 F (36.7 C) (Temporal)   Resp 16   LMP 10/15/2014 Comment: scheduled for hysterectomy in 1 month  SpO2 97%  There is no height or weight on file to calculate BMI. Physical Exam  Constitutional: She is oriented to person, place, and time. She appears well-developed and well-nourished.  HENT:  Head: Normocephalic and atraumatic.  Right Ear: External ear normal.  Left Ear: External ear normal.  Nose: Nose normal.  Mouth/Throat: Oropharynx is clear and moist.  Eyes: Conjunctivae and EOM are normal.  Neck: Neck supple.  Cardiovascular: Normal rate, regular rhythm and normal heart sounds. Exam reveals no gallop and no friction rub.  No murmur heard. Pulmonary/Chest: Effort normal and breath sounds normal. She has no wheezes. She has no rales.  Abdominal: Soft.  Neurological: She is alert and oriented to person, place, and time.  Skin: Skin is warm. No rash noted.  Psychiatric: She has a normal mood and affect. Her behavior is normal.  Nursing note and vitals reviewed.   Diagnostics:  Oral Challenge - 12/18/18 0900    BP  108/80    Pulse  97    Respirations  16    Lungs  Clear    Skin  Clear  Mouth  Clear    Time  0910    Dose  1.25 g    BP  100/70    Pulse  75    Respirations  16    Lungs  clear    Skin  clear    Mouth  clear    Additional Dose  Yes    Time  0931    Dose  5 g    BP  126/86    Pulse  79    Respirations  20    Lungs  C    Skin  C    Mouth  itching     Comments  stopped due to reaction    Time  1015    Dose  observation    BP  108/60    Pulse  61    Respirations  16    Lungs  clkear    Skin  clear    Mouth  clear      Previous notes and tests were reviewed. The plan was reviewed with the patient/family, and all questions/concerned were addressed.  It was my  pleasure to see [name] today and participate in her care. Please feel free to contact me with any questions or concerns.  Sincerely,  Rexene Alberts, DO Allergy & Immunology  [facility] office: [phone] [place]

## 2019-06-24 ENCOUNTER — Ambulatory Visit: Payer: Medicaid Other | Admitting: Allergy

## 2019-07-22 ENCOUNTER — Other Ambulatory Visit: Payer: Self-pay

## 2019-07-22 ENCOUNTER — Ambulatory Visit: Payer: Medicaid Other | Admitting: Allergy

## 2019-07-22 ENCOUNTER — Encounter: Payer: Self-pay | Admitting: Allergy

## 2019-07-22 VITALS — BP 130/94 | HR 64 | Temp 97.6°F | Resp 16 | Ht 63.0 in | Wt 210.4 lb

## 2019-07-22 DIAGNOSIS — R0602 Shortness of breath: Secondary | ICD-10-CM | POA: Diagnosis not present

## 2019-07-22 DIAGNOSIS — T781XXD Other adverse food reactions, not elsewhere classified, subsequent encounter: Secondary | ICD-10-CM | POA: Diagnosis not present

## 2019-07-22 DIAGNOSIS — L509 Urticaria, unspecified: Secondary | ICD-10-CM | POA: Diagnosis not present

## 2019-07-22 DIAGNOSIS — J3089 Other allergic rhinitis: Secondary | ICD-10-CM | POA: Diagnosis not present

## 2019-07-22 DIAGNOSIS — J302 Other seasonal allergic rhinitis: Secondary | ICD-10-CM

## 2019-07-22 MED ORDER — ALBUTEROL SULFATE HFA 108 (90 BASE) MCG/ACT IN AERS: 2.0000 | INHALATION_SPRAY | RESPIRATORY_TRACT | 1 refills | Status: DC | PRN

## 2019-07-22 NOTE — Assessment & Plan Note (Signed)
Past history - Shortness of breath episodes for the last 6 months. Mainly triggered by heat. Used to have albuterol inhaler during pregnancy but otherwise no formal diagnosis of asthma. 2020 spirometry was normal.  Interim history - feels like she's breathing heavy at times. Did not try albuterol.   Monitor symptoms.   Try the albuterol for the heavy breathing episodes. If not helping, recommend using the nasal spray every day as there was significant mucosal edema on exam today.   May use albuterol rescue inhaler 2 puffs or nebulizer every 4 to 6 hours as needed for shortness of breath, chest tightness, coughing, and wheezing. May use albuterol rescue inhaler 2 puffs 5 to 15 minutes prior to strenuous physical activities. Monitor frequency of use.

## 2019-07-22 NOTE — Assessment & Plan Note (Signed)
Past history - Currently avoiding peanuts, shellfish, egg and milk. Reactions to peanuts in the form or pruritus and shellfish in the form of throat swelling and hives. 2020 skin testing was positive to shellfish only. 2020 immunocap positive to shellfish, seafood, hazelnut; negative to milk and egg components. Failed egg challenge in 2020 Interim history - concerned about peanut oil as sometimes tolerates with no issues and sometimes develop throat itching.   Continue to avoid peanuts, shellfish, seafood and egg.   You may try peanut oil at home and see if it causes the itching.   Most people with peanut allergy tolerate peanut oil as there's not much peanut protein in the oil. Wonder if it was cross contamination with shellfish and/or seafood fried in the same peanut oil causing her itching symptoms sometimes.   For mild symptoms you can take over the counter antihistamines such as Benadryl and monitor symptoms closely. If symptoms worsen or if you have severe symptoms including breathing issues, throat closure, significant swelling, whole body hives, severe diarrhea and vomiting, lightheadedness then inject epinephrine and seek immediate medical care afterwards.  Food action plan in place.

## 2019-07-22 NOTE — Assessment & Plan Note (Signed)
Past history - Daily hives for the past 4 months with no specific triggers noted. Eliminated egg and milk but still having hives. Tried benadryl prn with good benefit.  Interim history - hives resolved with no antihistamines on board.   Monitor symptoms.   May take zyrtec 10mg  1-2 times a day as needed for hives.

## 2019-07-22 NOTE — Assessment & Plan Note (Signed)
Past history - Mild rhinitis symptoms. 2020 skin testing was positive to: grass, tree, dust mites, cockroach. Interim history - asymptomatic with no meds.   Continue environmental control measures.  Start Fluticasone nasal spray 1-2 sprays per nostril for nasal congestion.   May use over the counter antihistamines such as Zyrtec (cetirizine), Claritin (loratadine), Allegra (fexofenadine), or Xyzal (levocetirizine) daily as needed.

## 2019-07-22 NOTE — Patient Instructions (Addendum)
Adverse food reaction  Continue to avoid peanuts, shellfish, seafood and egg.   You may try peanut oil at home and see if it causes the itching.   For mild symptoms you can take over the counter antihistamines such as Benadryl and monitor symptoms closely. If symptoms worsen or if you have severe symptoms including breathing issues, throat closure, significant swelling, whole body hives, severe diarrhea and vomiting, lightheadedness then inject epinephrine and seek immediate medical care afterwards.  Food action plan in place.   Urticaria  Monitor symptoms.   May take zyrtec 10mg  1-2 times a day as needed for hives.   Seasonal and perennial allergic rhinitis 2020 skin testing was positive to: grass, tree, dust mites, cockroach.  Continue environmental control measures.  Start Fluticasone nasal spray 1-2 sprays per nostril for nasal congestion.   May use over the counter antihistamines such as Zyrtec (cetirizine), Claritin (loratadine), Allegra (fexofenadine), or Xyzal (levocetirizine) daily as needed.  Shortness of breath  Try the albuterol for the heavy breathing episodes. If not helping, recommend using the nasal spray every day.   May use albuterol rescue inhaler 2 puffs or nebulizer every 4 to 6 hours as needed for shortness of breath, chest tightness, coughing, and wheezing. May use albuterol rescue inhaler 2 puffs 5 to 15 minutes prior to strenuous physical activities. Monitor frequency of use.   Follow up in 3 months or sooner if needed.

## 2019-07-22 NOTE — Progress Notes (Signed)
Follow Up Note  RE: [name] MRN: [id number] DOB: 10-28-1970 Date of Office Visit: 07/22/2019  Referring provider: Chesley Noon, MD Primary care provider: Chesley Noon, MD  Chief Complaint: Food Intolerance (shellfish, peanuts, tree nuts, eggs, no accidental exposures, totally avoiding)  History of Present Illness: I had the pleasure of seeing [name] for a follow up visit at the [facility] on 07/22/2019. She is a 49 y.o. female, who is being followed for food allergies, urticaria, allergic rhinitis, shortness of breath. Her previous allergy office visit was on 12/18/2018 with Dr. Maudie Mercury. Today is a regular follow up visit.  Adverse food reaction Currently avoiding peanuts, shellfish, seafood and egg.  Sometimes gets itchy after eating foods fried in peanut oil when eating out but sometimes she doesn't.   No severe reactions since last OV and not had to use Epipen.   Urticaria No breaking out in hives anymore even with no antihistamines.   Seasonal and perennial allergic rhinitis Doing well with not taking daily antihistamines.   Shortness of breath Patient breathes hard at times as per her children but this occurs at rest. She is not sure if it comes from her chest or nasal passages. She has not tried albuterol yet. Complains of some headaches.  She had a complete hysterectomy since last visit and doing better.   Assessment and Plan: [name] is a 49 y.o. female with: Adverse food reaction Past history - Currently avoiding peanuts, shellfish, egg and milk. Reactions to peanuts in the form or pruritus and shellfish in the form of throat swelling and hives. 2020 skin testing was positive to shellfish only. 2020 immunocap positive to shellfish, seafood, hazelnut; negative to milk and egg components. Failed egg challenge in 2020 Interim history - concerned about peanut oil as sometimes tolerates with no issues and sometimes develop throat itching.    Continue to avoid peanuts, shellfish, seafood and egg.   You may try peanut oil at home and see if it causes the itching.   Most people with peanut allergy tolerate peanut oil as there's not much peanut protein in the oil. Wonder if it was cross contamination with shellfish and/or seafood fried in the same peanut oil causing her itching symptoms sometimes.   For mild symptoms you can take over the counter antihistamines such as Benadryl and monitor symptoms closely. If symptoms worsen or if you have severe symptoms including breathing issues, throat closure, significant swelling, whole body hives, severe diarrhea and vomiting, lightheadedness then inject epinephrine and seek immediate medical care afterwards.  Food action plan in place.   Shortness of breath Past history - Shortness of breath episodes for the last 6 months. Mainly triggered by heat. Used to have albuterol inhaler during pregnancy but otherwise no formal diagnosis of asthma. 2020 spirometry was normal.  Interim history - feels like she's breathing heavy at times. Did not try albuterol.   Monitor symptoms.   Try the albuterol for the heavy breathing episodes. If not helping, recommend using the nasal spray every day as there was significant mucosal edema on exam today.   May use albuterol rescue inhaler 2 puffs or nebulizer every 4 to 6 hours as needed for shortness of breath, chest tightness, coughing, and wheezing. May use albuterol rescue inhaler 2 puffs 5 to 15 minutes prior to strenuous physical activities. Monitor frequency of use.    Seasonal and perennial allergic rhinitis Past history - Mild rhinitis symptoms. 2020 skin testing was  positive to: grass, tree, dust mites, cockroach. Interim history - asymptomatic with no meds.   Continue environmental control measures.  Start Fluticasone nasal spray 1-2 sprays per nostril for nasal congestion.   May use over the counter antihistamines such as Zyrtec (cetirizine),  Claritin (loratadine), Allegra (fexofenadine), or Xyzal (levocetirizine) daily as needed.  Urticaria Past history - Daily hives for the past 4 months with no specific triggers noted. Eliminated egg and milk but still having hives. Tried benadryl prn with good benefit.  Interim history - hives resolved with no antihistamines on board.   Monitor symptoms.   May take zyrtec 10mg  1-2 times a day as needed for hives.   Return in about 3 months (around 10/19/2019).  Meds ordered this encounter  Medications  . albuterol (VENTOLIN HFA) 108 (90 Base) MCG/ACT inhaler    Sig: Inhale 2 puffs into the lungs every 4 (four) hours as needed.    Dispense:  18 g    Refill:  1   Diagnostics: None.  Medication List:  Current Outpatient Medications  Medication Sig Dispense Refill  . cetirizine (ZYRTEC ALLERGY) 10 MG tablet Take 1 tablet (10 mg total) by mouth 2 (two) times daily. 60 tablet 5  . dicyclomine (BENTYL) 20 MG tablet Take 1 tablet (20 mg total) by mouth 3 (three) times daily as needed for spasms (abdominal cramping). 20 tablet 0  . EPINEPHrine (EPIPEN 2-PAK) 0.3 mg/0.3 mL IJ SOAJ injection Inject 0.3 mLs (0.3 mg total) into the muscle as needed for anaphylaxis. 1 Device 2  . albuterol (VENTOLIN HFA) 108 (90 Base) MCG/ACT inhaler Inhale 2 puffs into the lungs every 4 (four) hours as needed. 18 g 1   No current facility-administered medications for this visit.   Allergies: Allergies  Allergen Reactions  . Shellfish Allergy Itching    Itching in throat   I reviewed her past medical history, social history, family history, and environmental history and no significant changes have been reported from her previous visit.  Review of Systems  Constitutional: Negative for appetite change, chills, fever and unexpected weight change.  HENT: Negative for congestion and rhinorrhea.   Eyes: Negative for itching.  Respiratory: Positive for shortness of breath. Negative for cough, chest tightness and  wheezing.   Cardiovascular: Negative for chest pain.  Gastrointestinal: Negative for abdominal pain.  Genitourinary: Negative for difficulty urinating.  Skin: Negative for rash.  Allergic/Immunologic: Positive for environmental allergies and food allergies.  Neurological: Positive for headaches.   Objective: BP (!) 130/94 (BP Location: Left Arm, Patient Position: Sitting, Cuff Size: Normal) Comment: Pt to follow up with PCP  Pulse 64   Temp 97.6 F (36.4 C) (Temporal)   Resp 16   Ht 5\' 3"  (1.6 m)   Wt 210 lb 6.4 oz (95.4 kg)   LMP 10/15/2014 Comment: scheduled for hysterectomy in 1 month  SpO2 97%   BMI 37.27 kg/m  Body mass index is 37.27 kg/m. Physical Exam  Constitutional: She is oriented to person, place, and time. She appears well-developed and well-nourished.  HENT:  Head: Normocephalic and atraumatic.  Right Ear: External ear normal.  Left Ear: External ear normal.  Nose: Mucosal edema present.  Mouth/Throat: Oropharynx is clear and moist.  Eyes: Conjunctivae and EOM are normal.  Cardiovascular: Normal rate, regular rhythm and normal heart sounds. Exam reveals no gallop and no friction rub.  No murmur heard. Pulmonary/Chest: Effort normal and breath sounds normal. She has no wheezes. She has no rales.  Abdominal: Soft.  Musculoskeletal:     Cervical back: Neck supple.  Neurological: She is alert and oriented to person, place, and time.  Skin: Skin is warm. No rash noted.  Psychiatric: She has a normal mood and affect. Her behavior is normal.  Nursing note and vitals reviewed.  Previous notes and tests were reviewed. The plan was reviewed with the patient/family, and all questions/concerned were addressed.  It was my pleasure to see [name] today and participate in her care. Please feel free to contact me with any questions or concerns.  Sincerely,  Rexene Alberts, DO Allergy & Immunology  [facility] [place] office:  [phone] [facility] office: [place] office: [phone]

## 2019-10-21 ENCOUNTER — Ambulatory Visit (INDEPENDENT_AMBULATORY_CARE_PROVIDER_SITE_OTHER): Payer: Medicaid Other | Admitting: Allergy

## 2019-10-21 ENCOUNTER — Encounter: Payer: Self-pay | Admitting: Allergy

## 2019-10-21 ENCOUNTER — Other Ambulatory Visit: Payer: Self-pay

## 2019-10-21 VITALS — BP 108/76 | HR 72 | Temp 97.9°F | Resp 16 | Ht 63.0 in | Wt 215.8 lb

## 2019-10-21 DIAGNOSIS — T781XXD Other adverse food reactions, not elsewhere classified, subsequent encounter: Secondary | ICD-10-CM | POA: Diagnosis not present

## 2019-10-21 DIAGNOSIS — J302 Other seasonal allergic rhinitis: Secondary | ICD-10-CM | POA: Diagnosis not present

## 2019-10-21 DIAGNOSIS — R0602 Shortness of breath: Secondary | ICD-10-CM | POA: Diagnosis not present

## 2019-10-21 DIAGNOSIS — J3089 Other allergic rhinitis: Secondary | ICD-10-CM | POA: Diagnosis not present

## 2019-10-21 MED ORDER — AZELASTINE HCL 0.15 % NA SOLN: 1.0000 | Freq: Two times a day (BID) | NASAL | 5 refills | Status: DC | PRN

## 2019-10-21 MED ORDER — AZELASTINE HCL 0.1 % NA SOLN: 1.0000 | Freq: Two times a day (BID) | NASAL | 5 refills | Status: AC | PRN

## 2019-10-21 MED ORDER — EPINEPHRINE 0.3 MG/0.3ML IJ SOAJ: 0.3000 mg | INTRAMUSCULAR | 2 refills | Status: AC | PRN

## 2019-10-21 MED ORDER — FLUTICASONE PROPIONATE 50 MCG/ACT NA SUSP: 1.0000 | Freq: Two times a day (BID) | NASAL | 5 refills | Status: AC

## 2019-10-21 NOTE — Assessment & Plan Note (Signed)
Past history - Mild rhinitis symptoms. 2020 skin testing was positive to: grass, tree, dust mites, cockroach. Interim history - increased nasal congestion. Not taking medications on a regular basis.   Continue environmental control measures.  Start Fluticasone nasal spray 1 spray per nostril twice a day for nasal congestion.  Start azelastine nasal spray 1-2 sprays per nostril twice a day as needed for nasal congestion.   Nasal saline spray (i.e., Simply Saline) or nasal saline lavage (i.e., NeilMed) is recommended as needed and prior to medicated nasal sprays.  May take a decongestant pill for the next few days until the nasal sprays start to work.    May use over the counter antihistamines such as Zyrtec (cetirizine), Claritin (loratadine), Allegra (fexofenadine), or Xyzal (levocetirizine) daily as needed.

## 2019-10-21 NOTE — Assessment & Plan Note (Signed)
Past history - Currently avoiding peanuts, shellfish, egg and milk. Reactions to peanuts in the form or pruritus and shellfish in the form of throat swelling and hives. 2020 skin testing was positive to shellfish only. 2020 immunocap positive to shellfish, seafood, hazelnut; negative to milk and egg components. Failed egg challenge in 2020 Interim history - did not try peanut oil. Had croaker fish with no issues.   Continue to avoid peanuts, shellfish, and egg.   You may try peanut oil at home and see if it causes the itching.   For mild symptoms you can take over the counter antihistamines such as Benadryl and monitor symptoms closely. If symptoms worsen or if you have severe symptoms including breathing issues, throat closure, significant swelling, whole body hives, severe diarrhea and vomiting, lightheadedness then inject epinephrine and seek immediate medical care afterwards.  Food action plan in place.

## 2019-10-21 NOTE — Patient Instructions (Addendum)
Adverse food reaction  Continue to avoid peanuts, shellfish, and egg.   You may try peanut oil at home and see if it causes the itching.   For mild symptoms you can take over the counter antihistamines such as Benadryl and monitor symptoms closely. If symptoms worsen or if you have severe symptoms including breathing issues, throat closure, significant swelling, whole body hives, severe diarrhea and vomiting, lightheadedness then inject epinephrine and seek immediate medical care afterwards.  Food action plan in place.   Seasonal and perennial allergic rhinitis 2020 skin testing was positive to: grass, tree, dust mites, cockroach.  Continue environmental control measures.  Start Fluticasone nasal spray 1 spray per nostril twice a day for nasal congestion.  Start azelastine nasal spray 1-2 sprays per nostril twice a day as needed for nasal congestion.   Nasal saline spray (i.e., Simply Saline) or nasal saline lavage (i.e., NeilMed) is recommended as needed and prior to medicated nasal sprays.  You may take a decongestant pill for the next few days until the nasal sprays start to work.    May use over the counter antihistamines such as Zyrtec (cetirizine), Claritin (loratadine), Allegra (fexofenadine), or Xyzal (levocetirizine) daily as needed.  Shortness of breath  May use albuterol rescue inhaler 2 puffs or nebulizer every 4 to 6 hours as needed for shortness of breath, chest tightness, coughing, and wheezing. May use albuterol rescue inhaler 2 puffs 5 to 15 minutes prior to strenuous physical activities. Monitor frequency of use.   Follow up in 3 months or sooner if needed.   Reducing Pollen Exposure . Pollen seasons: trees (spring), grass (summer) and ragweed/weeds (fall). Marland Kitchen Keep windows closed in your home and car to lower pollen exposure.  Susa Simmonds air conditioning in the bedroom and throughout the house if possible.  . Avoid going out in dry windy days - especially early  morning. . Pollen counts are highest between 5 - 10 AM and on dry, hot and windy days.  . Save outside activities for late afternoon or after a heavy rain, when pollen levels are lower.  . Avoid mowing of grass if you have grass pollen allergy. Marland Kitchen Be aware that pollen can also be transported indoors on people and pets.  . Dry your clothes in an automatic dryer rather than hanging them outside where they might collect pollen.  . Rinse hair and eyes before bedtime. Control of House Dust Mite Allergen . Dust mite allergens are a common trigger of allergy and asthma symptoms. While they can be found throughout the house, these microscopic creatures thrive in warm, humid environments such as bedding, upholstered furniture and carpeting. . Because so much time is spent in the bedroom, it is essential to reduce mite levels there.  . Encase pillows, mattresses, and box springs in special allergen-proof fabric covers or airtight, zippered plastic covers.  . Bedding should be washed weekly in hot water (130 F) and dried in a hot dryer. Allergen-proof covers are available for comforters and pillows that can't be regularly washed.  Wendee Copp the allergy-proof covers every few months. Minimize clutter in the bedroom. Keep pets out of the bedroom.  Marland Kitchen Keep humidity less than 50% by using a dehumidifier or air conditioning. You can buy a humidity measuring device called a hygrometer to monitor this.  . If possible, replace carpets with hardwood, linoleum, or washable area rugs. If that's not possible, vacuum frequently with a vacuum that has a HEPA filter. . Remove all upholstered furniture and  non-washable window drapes from the bedroom. . Remove all non-washable stuffed toys from the bedroom.  Wash stuffed toys weekly. Cockroach Allergen Avoidance Cockroaches are often found in the homes of densely populated urban areas, schools or commercial buildings, but these creatures can lurk almost anywhere. This does not  mean that you have a dirty house or living area. . Block all areas where roaches can enter the home. This includes crevices, wall cracks and windows.  . Cockroaches need water to survive, so fix and seal all leaky faucets and pipes. Have an exterminator go through the house when your family and pets are gone to eliminate any remaining roaches. Marland Kitchen Keep food in lidded containers and put pet food dishes away after your pets are done eating. Vacuum and sweep the floor after meals, and take out garbage and recyclables. Use lidded garbage containers in the kitchen. Wash dishes immediately after use and clean under stoves, refrigerators or toasters where crumbs can accumulate. Wipe off the stove and other kitchen surfaces and cupboards regularly.

## 2019-10-21 NOTE — Assessment & Plan Note (Signed)
Past history - Shortness of breath episodes for the last 6 months. Mainly triggered by heat. Used to have albuterol inhaler during pregnancy but otherwise no formal diagnosis of asthma. 2020 spirometry was normal.  Interim history - using albuterol 1-2 times a week with good benefit.   Monitor symptoms.   May use albuterol rescue inhaler 2 puffs or nebulizer every 4 to 6 hours as needed for shortness of breath, chest tightness, coughing, and wheezing. May use albuterol rescue inhaler 2 puffs 5 to 15 minutes prior to strenuous physical activities. Monitor frequency of use.

## 2019-10-21 NOTE — Progress Notes (Addendum)
Follow Up Note  RE: [name] MRN: [id number] DOB: Nov 01, 1970 Date of Office Visit: 10/21/2019  Referring provider: Chesley Noon, MD Primary care provider: Chesley Noon, MD  Chief Complaint: Food Intolerance (shellfish, eggs, eating finned fish) and Sinus Problem (sinuses are plugged)  History of Present Illness: I had the pleasure of seeing [name] for a follow up visit at the [facility] [place] on 10/21/2019. She is a 49 y.o. female, who is being followed for adverse food reaction, shortness of breath, allergic rhinitis and urticaria. Her previous allergy office visit was on 07/22/2019 with Dr. Maudie Mercury. Today is a regular follow up visit.  Adverse food reaction Currently avoiding eggs, peanuts, shellfish. Did not try peanut oil at home. She tried some croaker fish with no issues. No reactions since the last visit.  Needs Epipen refill.  Shortness of breath Using albuterol 1-2 times a week for shortness of breath with good benefit. Denies any ER/urgent care visits or prednisone use since the last visit.  Seasonal and perennial allergic rhinitis Having some nasal congestion and clear rhinorrhea. She was using Flonase 1 spray per nostril as needed with some benefit. Needs refill.  Not taking any additional medications.  Denies any fevers or chills.  Urticaria Resolved.  Assessment and Plan: [name] is a 49 y.o. female with: Seasonal and perennial allergic rhinitis Past history - Mild rhinitis symptoms. 2020 skin testing was positive to: grass, tree, dust mites, cockroach. Interim history - increased nasal congestion. Not taking medications on a regular basis.   Continue environmental control measures.  Start Fluticasone nasal spray 1 spray per nostril twice a day for nasal congestion.  Start azelastine nasal spray 1-2 sprays per nostril twice a day as needed for nasal congestion.   Nasal saline spray (i.e., Simply Saline) or nasal saline lavage  (i.e., NeilMed) is recommended as needed and prior to medicated nasal sprays.  May take a decongestant pill for the next few days until the nasal sprays start to work.    May use over the counter antihistamines such as Zyrtec (cetirizine), Claritin (loratadine), Allegra (fexofenadine), or Xyzal (levocetirizine) daily as needed.  Adverse food reaction Past history - Currently avoiding peanuts, shellfish, egg and milk. Reactions to peanuts in the form or pruritus and shellfish in the form of throat swelling and hives. 2020 skin testing was positive to shellfish only. 2020 immunocap positive to shellfish, seafood, hazelnut; negative to milk and egg components. Failed egg challenge in 2020 Interim history - did not try peanut oil. Had croaker fish with no issues.   Continue to avoid peanuts, shellfish, and egg.   You may try peanut oil at home and see if it causes the itching.   For mild symptoms you can take over the counter antihistamines such as Benadryl and monitor symptoms closely. If symptoms worsen or if you have severe symptoms including breathing issues, throat closure, significant swelling, whole body hives, severe diarrhea and vomiting, lightheadedness then inject epinephrine and seek immediate medical care afterwards.  Food action plan in place.   Shortness of breath Past history - Shortness of breath episodes for the last 6 months. Mainly triggered by heat. Used to have albuterol inhaler during pregnancy but otherwise no formal diagnosis of asthma. 2020 spirometry was normal.  Interim history - using albuterol 1-2 times a week with good benefit.   Monitor symptoms.   May use albuterol rescue inhaler 2 puffs or nebulizer every 4 to 6 hours as needed  for shortness of breath, chest tightness, coughing, and wheezing. May use albuterol rescue inhaler 2 puffs 5 to 15 minutes prior to strenuous physical activities. Monitor frequency of use.   Return in about 3 months (around  01/21/2020).  Meds ordered this encounter  Medications  . EPINEPHrine (EPIPEN 2-PAK) 0.3 mg/0.3 mL IJ SOAJ injection    Sig: Inject 0.3 mLs (0.3 mg total) into the muscle as needed for anaphylaxis.    Dispense:  2 each    Refill:  2    Please dispense Mylan or Teva brand  . fluticasone (FLONASE) 50 MCG/ACT nasal spray    Sig: Place 1 spray into both nostrils in the morning and at bedtime. For nasal congestion    Dispense:  16 g    Refill:  5  . DISCONTD: Azelastine HCl 0.15 % SOLN    Sig: Place 1-2 sprays into the nose 2 (two) times daily as needed (runny nose).    Dispense:  30 mL    Refill:  5  . azelastine (ASTELIN) 0.1 % nasal spray    Sig: Place 1-2 sprays into both nostrils 2 (two) times daily as needed (runny nose). Use in each nostril as directed    Dispense:  30 mL    Refill:  5   Diagnostics: None.  Medication List:  Current Outpatient Medications  Medication Sig Dispense Refill  . albuterol (VENTOLIN HFA) 108 (90 Base) MCG/ACT inhaler Inhale 2 puffs into the lungs every 4 (four) hours as needed. 18 g 1  . cetirizine (ZYRTEC ALLERGY) 10 MG tablet Take 1 tablet (10 mg total) by mouth 2 (two) times daily. 60 tablet 5  . EPINEPHrine (EPIPEN 2-PAK) 0.3 mg/0.3 mL IJ SOAJ injection Inject 0.3 mLs (0.3 mg total) into the muscle as needed for anaphylaxis. 2 each 2  . azelastine (ASTELIN) 0.1 % nasal spray Place 1-2 sprays into both nostrils 2 (two) times daily as needed (runny nose). Use in each nostril as directed 30 mL 5  . fluticasone (FLONASE) 50 MCG/ACT nasal spray Place 1 spray into both nostrils in the morning and at bedtime. For nasal congestion 16 g 5   No current facility-administered medications for this visit.   Allergies: Allergies  Allergen Reactions  . Shellfish Allergy Itching    Itching in throat   I reviewed her past medical history, social history, family history, and environmental history and no significant changes have been reported from her previous  visit.  Review of Systems  Constitutional: Negative for appetite change, chills, fever and unexpected weight change.  HENT: Negative for congestion and rhinorrhea.   Eyes: Negative for itching.  Respiratory: Positive for shortness of breath. Negative for cough, chest tightness and wheezing.   Cardiovascular: Negative for chest pain.  Gastrointestinal: Negative for abdominal pain.  Genitourinary: Negative for difficulty urinating.  Skin: Negative for rash.  Allergic/Immunologic: Positive for environmental allergies and food allergies.   Objective: BP 108/76 (BP Location: Left Arm, Patient Position: Sitting, Cuff Size: Large)   Pulse 72   Temp 97.9 F (36.6 C) (Temporal)   Resp 16   Ht 5\' 3"  (1.6 m)   Wt 215 lb 12.8 oz (97.9 kg)   LMP 10/15/2014 Comment: scheduled for hysterectomy in 1 month  SpO2 99%   BMI 38.23 kg/m  Body mass index is 38.23 kg/m. Physical Exam  Constitutional: She is oriented to person, place, and time. She appears well-developed and well-nourished.  HENT:  Head: Normocephalic and atraumatic.  Right Ear: External ear  normal.  Left Ear: External ear normal.  Nose: Mucosal edema (on left side) present.  Mouth/Throat: Oropharynx is clear and moist.  Eyes: Conjunctivae and EOM are normal.  Cardiovascular: Normal rate, regular rhythm and normal heart sounds. Exam reveals no gallop and no friction rub.  No murmur heard. Pulmonary/Chest: Effort normal and breath sounds normal. She has no wheezes. She has no rales.  Abdominal: Soft.  Musculoskeletal:     Cervical back: Neck supple.  Neurological: She is alert and oriented to person, place, and time.  Skin: Skin is warm. No rash noted.  Psychiatric: She has a normal mood and affect. Her behavior is normal.  Nursing note and vitals reviewed.  Previous notes and tests were reviewed. The plan was reviewed with the patient/family, and all questions/concerned were addressed.  It was my pleasure to see [name] today  and participate in her care. Please feel free to contact me with any questions or concerns.  Sincerely,  Rexene Alberts, DO Allergy & Immunology  [facility] office: [phone] [facility] office: [place] office: [phone]

## 2019-10-21 NOTE — Addendum Note (Signed)
Addended by: Garnet Sierras on: 10/21/2019 04:42 PM   Modules accepted: Orders

## 2020-01-27 ENCOUNTER — Ambulatory Visit: Payer: Medicaid Other | Admitting: Allergy

## 2020-03-14 ENCOUNTER — Other Ambulatory Visit: Payer: Self-pay | Admitting: Allergy
